# Patient Record
Sex: Female | Born: 1965 | Race: White | Hispanic: No | Marital: Married | Smoking: Current every day smoker
Health system: Southern US, Community
[De-identification: ages and names within clinical notes are randomized; demographics above are authoritative.]

## PROBLEM LIST (undated history)

## (undated) DIAGNOSIS — E079 Disorder of thyroid, unspecified: Secondary | ICD-10-CM

## (undated) DIAGNOSIS — I219 Acute myocardial infarction, unspecified: Secondary | ICD-10-CM

## (undated) HISTORY — PX: KNEE SURGERY: SHX244

---

## 1998-07-02 ENCOUNTER — Emergency Department (HOSPITAL_COMMUNITY): Admission: EM | Admit: 1998-07-02 | Discharge: 1998-07-02 | Payer: Self-pay | Admitting: Emergency Medicine

## 1999-08-20 ENCOUNTER — Emergency Department (HOSPITAL_COMMUNITY): Admission: EM | Admit: 1999-08-20 | Discharge: 1999-08-20 | Payer: Self-pay

## 1999-11-09 ENCOUNTER — Emergency Department (HOSPITAL_COMMUNITY): Admission: EM | Admit: 1999-11-09 | Discharge: 1999-11-09 | Payer: Self-pay | Admitting: Emergency Medicine

## 2000-04-12 ENCOUNTER — Emergency Department (HOSPITAL_COMMUNITY): Admission: EM | Admit: 2000-04-12 | Discharge: 2000-04-12 | Payer: Self-pay | Admitting: *Deleted

## 2000-04-20 ENCOUNTER — Emergency Department (HOSPITAL_COMMUNITY): Admission: EM | Admit: 2000-04-20 | Discharge: 2000-04-20 | Payer: Self-pay

## 2000-05-06 ENCOUNTER — Emergency Department (HOSPITAL_COMMUNITY): Admission: EM | Admit: 2000-05-06 | Discharge: 2000-05-06 | Payer: Self-pay | Admitting: *Deleted

## 2000-05-10 ENCOUNTER — Inpatient Hospital Stay (HOSPITAL_COMMUNITY): Admission: AD | Admit: 2000-05-10 | Discharge: 2000-05-10 | Payer: Self-pay | Admitting: *Deleted

## 2000-05-10 ENCOUNTER — Encounter: Payer: Self-pay | Admitting: *Deleted

## 2000-09-14 ENCOUNTER — Ambulatory Visit (HOSPITAL_COMMUNITY): Admission: RE | Admit: 2000-09-14 | Discharge: 2000-09-14 | Payer: Self-pay | Admitting: Obstetrics

## 2000-09-14 ENCOUNTER — Encounter: Payer: Self-pay | Admitting: Obstetrics

## 2000-10-31 ENCOUNTER — Inpatient Hospital Stay (HOSPITAL_COMMUNITY): Admission: AD | Admit: 2000-10-31 | Discharge: 2000-10-31 | Payer: Self-pay | Admitting: *Deleted

## 2000-12-11 ENCOUNTER — Encounter (INDEPENDENT_AMBULATORY_CARE_PROVIDER_SITE_OTHER): Payer: Self-pay | Admitting: Specialist

## 2000-12-11 ENCOUNTER — Inpatient Hospital Stay (HOSPITAL_COMMUNITY): Admission: AD | Admit: 2000-12-11 | Discharge: 2000-12-14 | Payer: Self-pay | Admitting: Registered Nurse

## 2002-08-29 ENCOUNTER — Encounter (INDEPENDENT_AMBULATORY_CARE_PROVIDER_SITE_OTHER): Payer: Self-pay | Admitting: Specialist

## 2002-08-30 ENCOUNTER — Ambulatory Visit (HOSPITAL_COMMUNITY): Admission: RE | Admit: 2002-08-30 | Discharge: 2002-08-30 | Payer: Self-pay | Admitting: Obstetrics

## 2006-08-02 ENCOUNTER — Ambulatory Visit (HOSPITAL_COMMUNITY): Admission: RE | Admit: 2006-08-02 | Discharge: 2006-08-02 | Payer: Self-pay | Admitting: Obstetrics

## 2006-08-02 ENCOUNTER — Encounter (INDEPENDENT_AMBULATORY_CARE_PROVIDER_SITE_OTHER): Payer: Self-pay | Admitting: *Deleted

## 2009-01-23 ENCOUNTER — Ambulatory Visit: Payer: Self-pay | Admitting: Diagnostic Radiology

## 2009-01-23 ENCOUNTER — Emergency Department (HOSPITAL_BASED_OUTPATIENT_CLINIC_OR_DEPARTMENT_OTHER): Admission: EM | Admit: 2009-01-23 | Discharge: 2009-01-23 | Payer: Self-pay | Admitting: Emergency Medicine

## 2009-02-26 ENCOUNTER — Emergency Department (HOSPITAL_BASED_OUTPATIENT_CLINIC_OR_DEPARTMENT_OTHER): Admission: EM | Admit: 2009-02-26 | Discharge: 2009-02-26 | Payer: Self-pay | Admitting: Emergency Medicine

## 2009-02-26 ENCOUNTER — Ambulatory Visit: Payer: Self-pay | Admitting: Diagnostic Radiology

## 2011-01-01 NOTE — Op Note (Signed)
[facility]  Patient:    Rachel Maldonado, Rachel Maldonado                       MRN: [id number] Proc. Date: 12/11/00 Adm. Date:  [id number] Attending:  Venita Sheffield                           Operative Report  PREOPERATIVE DIAGNOSES:       1. Intrauterine pregnancy approximately                                  [redacted] weeks gestation with premature rupture                                  of membranes.                               2. Breech presentation.                               3. Active labor.  POSTOPERATIVE DIAGNOSES:      1. Intrauterine pregnancy approximately                                  [redacted] weeks gestation with premature rupture                                  of membranes.                               2. Breech presentation.                               3. Active labor.                               4. Viable female infant weighing 6 pounds                                  1 ounce with an Apgar of 7 and 9.  OPERATION:                    Primary low transverse cervical cesarean                               section.  SURGEON:                      Deniece Ree, M.D.  ANESTHESIA:                   Epidural, Dr. Vita Erm, Montez Hageman., M.D.  PEDIATRICS:                   Alver Sorrow. Mikle Bosworth, M.D.  ESTIMATED BLOOD LOSS:  350 cc.  COMPLICATIONS:                None.  DISPOSITION:                  The patient tolerated the procedure well and returned to the recovery room in satisfactory condition.  DESCRIPTION OF PROCEDURE:     The patient was taken to the operating room, prepped and draped in the usual fashion for a primary cesarean section. A low Pfannenstiel incision was made following the course of the previous incision. This was carried down to the fascia at which time the fascia was excised the extent of the incision. The midline was identified and rectus muscles separated. The abdominoperitoneum was then entered in a  vertical fashion using the Metzenbaum scissors. The visceral peritoneum was then excised bilaterally toward the round ligaments following which the lower uterine segment was scored, entered in the visceral peritoneum, and then excised bilaterally toward the round ligaments. The lower uterine segment was then scored, entered in the midline and then bluntly dissected open. With some difficulty, the right hand was introduced and with manipulation, the breech was delivered, the the infant was in a frank breech position. The nares and the pharynx was then sucked out with a suction bulb. The cord was clamped and the infant turned over to the pediatrician who was in attendance. Cord blood was then obtained following which the placenta, as well as all products of conception, were then manually removed from the uterine cavity. IV Pitocin as well as IV antibiotics were then begun. The myometrium was then closed using #1 chromic in a running locking stitch followed by an imbricated stitch, again using #1 chromic. Reperitonealization was carried out using 2-0 chromic in a running stitch. Hemostasis was present. Sponge and needle count was correct x 2. The abdominoperitoneum was then closed using 2-0 chromic in a running stitch followed by closure of the fascia using #1 Dexon in a running stitch. A subcuticular stitch using 4-0 Monocryl was used to close the skin. The procedure was then terminated. The patient tolerated the procedure well and returned to the recovery room in satisfactory condition. DD:  12/11/00 TD:  12/11/00 Job: [id number]

## 2011-01-01 NOTE — Discharge Summary (Signed)
[facility]  Patient:    Rachel Maldonado, Rachel Maldonado                       MRN: [id number] Adm. Date:  [id number] Disc. Date: 12/14/00 Attending:  Venita Sheffield                           Discharge Summary  HISTORY:                      The patient is a 45 year old primigravida who was admitted with ruptured membranes and labor with a breech presentation on December 11, 2000.  HOSPITAL COURSE:              She underwent a primary low transverse cesarean section, and had a female, Apgars 7 and 9, weighing 6 pounds 1 ounce. Postoperatively, her hemoglobin was 13. She did well. She was discharged home on the third postoperative day ambulatory and or a regular diet.  DISCHARGE MEDICATIONS:        Tylox one p.o. q.3-4h. p.r.n.  DISCHARGE FOLLOWUP:           The patient is to see me in six weeks.  DISCHARGE DIAGNOSIS:          Status post primary low transverse cesarean section at 36 weeks because of ruptured membranes, labor, and breech presentation. DD:  12/14/00 TD:  12/14/00 Job: [id number]

## 2011-01-01 NOTE — Op Note (Signed)
Rachel Maldonado, Rachel Maldonado              ACCOUNT NO.:  [id number]   MEDICAL RECORD NO.:  [id number]          PATIENT TYPE:  AMB   LOCATION:  SDC                           FACILITY:  [facility]   PHYSICIAN:  Kathreen Cosier, M.D.DATE OF BIRTH:  1965-10-19   DATE OF PROCEDURE:  08/02/2006  DATE OF DISCHARGE:                               OPERATIVE REPORT   PREOPERATIVE DIAGNOSIS:  Dysfunctional uterine bleeding.   PROCEDURE:  Hysteroscopy, dilatation and curettage, and NovaSure  ablation/   SURGEON:  Kathreen Cosier, M.D.   ANESTHESIA:  General.   PROCEDURE:  Patient placed on the operating table in lithotomy position  after general anesthesia administered.  Abdomen, perineum and vagina  prepped and draped, bladder emptied with a straight catheter.  Bimanual  exam revealed the uterus to be normal size, negative adnexa.  A speculum  placed in the vagina, cervix grasped with a single-tooth tenaculum.  Cervix injected with 19 mL of 1% Xylocaine, endocervix curetted, small  amount of tissue obtained and sent to pathology.  Endometrial cavity  sounded to 9 cm.  The cavity was posterior.  The cervix was measured at  5 cm, cavity length 4 cm.  Cervix dilated to a #27 Pratt, hysteroscope  inserted.  The cavity was noted to have a posterior polyp, otherwise  normal.  Sharp curettage performed, a large amount of tissue obtained.  NovaSure device was inserted.  The cavity width was 4 cm and the cavity  integrity test was passed and ablation occurred for 1 minute 29 seconds  at 88 watts.  Repeat hysteroscopy showed total ablation of the cavity.  Fluid deficit was 35 mL.  Endometrial tissue was sent to pathology.  The  patient tolerated the procedure well, taken to the recovery room in good  condition.           ______________________________  Kathreen Cosier, M.D.     BAM/MEDQ  D:  08/02/2006  T:  08/02/2006  Job:  [id number]

## 2011-01-01 NOTE — H&P (Signed)
[facility]  Patient:    Rachel Maldonado, Rachel Maldonado                       MRN: [id number] Adm. Date:  [id number] Attending:  Venita Sheffield                         History and Physical  HISTORY:                      The patient is a 45 year old primigravida, approximately 62 to [redacted] weeks gestation who is admitted because of premature ruptured membranes and a breech presentation and in active labor. The patient stated that her membranes ruptured with a large gush of fluid approximately two hours prior to her coming to triage. At the time of coming to triage, she was noted to have grossly ruptured membranes, a breech presentation, and active labor. She was scheduled for a cesarean section.  PAST MEDICAL HISTORY:         Significant in that patient had left ovary and tube removed in the past. She has also had a left breast lumpectomy. The patient has a history of pelvic inflammatory disease.  PHYSICAL EXAMINATION:  GENERAL:                      Well-developed, well-nourished female in labor-type distress.  HEENT:                        Within normal limits.  NECK:                         Supple.  BREASTS:                      Without masses, tenderness, or discharge.  LUNGS:                        Lungs clear to percussion and auscultation.  HEART:                        Normal sinus rhythm without murmur, rub, or gallop.  ABDOMEN:                      Approximately [redacted] weeks gestation size with good fetal heart tones.  EXTREMITIES:                  Within normal limits.  NEUROLOGIC:                   Within normal limits.  PELVIC:                       Cervix 2 cm dilated, breech presentation, and having good strong uterine contractions.  DIAGNOSIS:                    Intrauterine pregnancy with premature rupture of membranes, breech presentation.  PLAN:                         The plan is to proceed with a primary cesarean section. DD:   12/11/00 TD:  12/11/00 Job: [id number]

## 2011-01-01 NOTE — Op Note (Signed)
   NAMERYLLIE, NIELAND                        ACCOUNT NO.:  [id number]   MEDICAL RECORD NO.:  [id number]                   PATIENT TYPE:  AMB   LOCATION:  SDC                                  FACILITY:  [facility]   PHYSICIAN:  Kathreen Cosier, M.D.           DATE OF BIRTH:  12/02/1965   DATE OF PROCEDURE:  08/30/2002  DATE OF DISCHARGE:                                 OPERATIVE REPORT   PREOPERATIVE DIAGNOSIS:  Dysfunctional uterine bleeding.   POSTOPERATIVE DIAGNOSIS:  Dysfunctional uterine bleeding.   PROCEDURE:  Dilatation and curettage.   DESCRIPTION OF PROCEDURE:  Using MAC, patient in lithotomy position, the  perineum and vagina were prepped and draped, the bladder emptied with a  straight catheter.  Bimanual exam revealed the uterus to be enlarged with  myomas on the left, no prolapse.  A weighted speculum placed in the vagina.  The cervix was injected at 3, 9, and 12 o'clock with a total of 9 cubic  centimeters of 1% plain Xylocaine.  The anterior lip of the cervix was  grasped with a tenaculum and the endocervix curetted with a small amount of  tissue obtained.  The endometrial cavity was sounded to 9 cm.  The cervix  dilated to a #27 Shawnie Pons.  A thorough sharp curettage was done, obtaining a  large amount of tissue.  Polyp forceps were then inserted and no polyps  obtained.  The patient tolerated the procedure well, taken to the recovery  room in good condition.                                               Kathreen Cosier, M.D.    BAM/MEDQ  D:  08/30/2002  T:  08/30/2002  Job:  [id number]

## 2013-09-19 DIAGNOSIS — E039 Hypothyroidism, unspecified: Secondary | ICD-10-CM | POA: Diagnosis present

## 2013-11-29 DIAGNOSIS — I2510 Atherosclerotic heart disease of native coronary artery without angina pectoris: Secondary | ICD-10-CM

## 2014-10-04 DIAGNOSIS — I10 Essential (primary) hypertension: Secondary | ICD-10-CM | POA: Diagnosis present

## 2015-06-08 ENCOUNTER — Encounter (HOSPITAL_COMMUNITY): Payer: Self-pay | Admitting: Emergency Medicine

## 2015-06-08 ENCOUNTER — Other Ambulatory Visit: Payer: Self-pay

## 2015-06-08 ENCOUNTER — Emergency Department (HOSPITAL_COMMUNITY)
Admission: EM | Admit: 2015-06-08 | Discharge: 2015-06-09 | Disposition: A | Discharge status: Home / Self Care | Payer: Self-pay | Attending: Emergency Medicine | Admitting: Emergency Medicine

## 2015-06-08 ENCOUNTER — Emergency Department (HOSPITAL_COMMUNITY): Payer: Self-pay

## 2015-06-08 DIAGNOSIS — Z7982 Long term (current) use of aspirin: Secondary | ICD-10-CM | POA: Insufficient documentation

## 2015-06-08 DIAGNOSIS — I252 Old myocardial infarction: Secondary | ICD-10-CM | POA: Insufficient documentation

## 2015-06-08 DIAGNOSIS — Z720 Tobacco use: Secondary | ICD-10-CM | POA: Insufficient documentation

## 2015-06-08 DIAGNOSIS — R42 Dizziness and giddiness: Secondary | ICD-10-CM | POA: Insufficient documentation

## 2015-06-08 DIAGNOSIS — Z7902 Long term (current) use of antithrombotics/antiplatelets: Secondary | ICD-10-CM | POA: Insufficient documentation

## 2015-06-08 DIAGNOSIS — Z79899 Other long term (current) drug therapy: Secondary | ICD-10-CM | POA: Insufficient documentation

## 2015-06-08 HISTORY — DX: Acute myocardial infarction, unspecified: I21.9

## 2015-06-08 LAB — BASIC METABOLIC PANEL
Anion gap: 9 (ref 5–15)
BUN: 13 mg/dL (ref 6–20)
CALCIUM: 9.1 mg/dL (ref 8.9–10.3)
CO2: 24 mmol/L (ref 22–32)
CREATININE: 0.93 mg/dL (ref 0.44–1.00)
Chloride: 102 mmol/L (ref 101–111)
GFR calc Af Amer: >60 mL/min (ref >60)
Glucose, Bld: 149 mg/dL — ABNORMAL HIGH (ref 65–99)
Potassium: 2.8 mmol/L — ABNORMAL LOW (ref 3.5–5.1)
SODIUM: 135 mmol/L (ref 135–145)

## 2015-06-08 LAB — CBC WITH DIFFERENTIAL/PLATELET
Basophils Absolute: 0 10*3/uL (ref 0.0–0.1)
Basophils Relative: 0 %
EOS ABS: 0.3 10*3/uL (ref 0.0–0.7)
EOS PCT: 2 %
HCT: 46.2 % — ABNORMAL HIGH (ref 36.0–46.0)
Hemoglobin: 15.7 g/dL — ABNORMAL HIGH (ref 12.0–15.0)
LYMPHS ABS: 3.8 10*3/uL (ref 0.7–4.0)
Lymphocytes Relative: 30 %
MCH: 31.3 pg (ref 26.0–34.0)
MCHC: 34 g/dL (ref 30.0–36.0)
MCV: 92 fL (ref 78.0–100.0)
MONOS PCT: 6 %
Monocytes Absolute: 0.7 10*3/uL (ref 0.1–1.0)
Neutro Abs: 7.5 10*3/uL (ref 1.7–7.7)
Neutrophils Relative %: 62 %
PLATELETS: 407 10*3/uL — AB (ref 150–400)
RBC: 5.02 MIL/uL (ref 3.87–5.11)
RDW: 13.6 % (ref 11.5–15.5)
WBC: 12.3 10*3/uL — AB (ref 4.0–10.5)

## 2015-06-08 LAB — TROPONIN I

## 2015-06-08 MED ORDER — MECLIZINE HCL 25 MG PO TABS
25.0000 mg | ORAL_TABLET | Freq: Once | ORAL | Status: AC
  Administered 2015-06-08: 25 mg via ORAL
  Filled 2015-06-08: qty 1

## 2015-06-08 MED ORDER — HYDROMORPHONE HCL 1 MG/ML IJ SOLN: 1.0000 mg | Freq: Once | INTRAMUSCULAR | Status: DC

## 2015-06-08 MED ORDER — ONDANSETRON 4 MG PO TBDP
4.0000 mg | ORAL_TABLET | Freq: Once | ORAL | Status: AC
  Administered 2015-06-08: 4 mg via ORAL
  Filled 2015-06-08: qty 1

## 2015-06-08 MED ORDER — KETOROLAC TROMETHAMINE 30 MG/ML IJ SOLN: 30.0000 mg | Freq: Once | INTRAMUSCULAR | Status: DC

## 2015-06-08 NOTE — ED Notes (Signed)
Pt drank water with no issues  ?

## 2015-06-08 NOTE — ED Provider Notes (Signed)
CSN: [id number]     Arrival date & time 06/08/15  2154 History   First MD Initiated Contact with Patient 06/08/15 2211     Chief Complaint  Patient presents with  . Dizziness     (Consider location/radiation/quality/duration/timing/severity/associated sxs/prior Treatment) HPI Comments: Patient presents with a 2 day history of room spinning dizziness that is worse with movement and better with rest. She reports a history of similar episodes attributed to inner ear problems in the past. Denies any recent URI symptoms or fever or sinus issues. Has had nausea but no vomiting. No focal weakness, numbness or tingling. No vision change. Has had falls but did not hit her head. No loss of consciousness. No chest pain or shortness of breath. History of NSTEMI last year that was medically managed at [facility].  The history is provided by the patient.    Past Medical History  Diagnosis Date  . Heart attack [employer](HCC)    Past Surgical History  Procedure Laterality Date  . Knee surgery     No family history on file. Social History  Substance Use Topics  . Smoking status: Current Every Day Smoker -- 0.75 packs/day    Types: Cigarettes  . Smokeless tobacco: None  . Alcohol Use: No   OB History    No data available     Review of Systems  Constitutional: Negative for fever, activity change and appetite change.  HENT: Negative for congestion.   Eyes: Negative for photophobia and visual disturbance.  Respiratory: Negative for cough, chest tightness and shortness of breath.   Cardiovascular: Negative for chest pain.  Gastrointestinal: Negative for nausea, vomiting and abdominal pain.  Genitourinary: Negative for dysuria, vaginal bleeding and vaginal discharge.  Musculoskeletal: Negative for myalgias, back pain and arthralgias.  Neurological: Positive for dizziness. Negative for syncope, weakness, light-headedness, numbness and headaches.  A complete 10 system review of systems was obtained and  all systems are negative except as noted in the HPI and PMH.      Allergies  Review of patient's allergies indicates no known allergies.  Home Medications   Prior to Admission medications   Medication Sig Start Date End Date Taking? Authorizing Provider  aspirin EC 325 MG tablet Take 325 mg by mouth daily.   Yes Historical Provider, MD  atorvastatin (LIPITOR) 40 MG tablet Take 20 mg by mouth every other day.   Yes Historical Provider, MD  carvedilol (COREG) 6.25 MG tablet Take 6.25 mg by mouth 2 (two) times daily. 04/19/14  Yes Historical Provider, MD  clopidogrel (PLAVIX) 75 MG tablet Take 75 mg by mouth daily. 06/05/15  Yes Historical Provider, MD  hydrochlorothiazide (HYDRODIURIL) 25 MG tablet Take 25 mg by mouth daily. 06/05/15  Yes Historical Provider, MD  levothyroxine (SYNTHROID, LEVOTHROID) 125 MCG tablet Take 125 mcg by mouth daily.   Yes Historical Provider, MD  lisinopril (PRINIVIL,ZESTRIL) 10 MG tablet Take 10 mg by mouth daily. 06/05/15  Yes Historical Provider, MD  Omega-3 1000 MG CAPS Take 1 g by mouth 2 (two) times daily.   Yes Historical Provider, MD  meclizine (ANTIVERT) 12.5 MG tablet Take 1 tablet (12.5 mg total) by mouth 3 (three) times daily as needed for dizziness. 06/09/15   Glynn OctaveStephen Donovon Micheletti, MD  ondansetron (ZOFRAN) 4 MG tablet Take 1 tablet (4 mg total) by mouth every 6 (six) hours. 06/09/15   Glynn OctaveStephen Jakaden Ouzts, MD   BP 132/78 mmHg  Pulse 87  Temp(Src) 97.6 F (36.4 C) (Oral)  Resp 16  Ht 5'  3" (1.6 m)  SpO2 95% Physical Exam  Constitutional: She is oriented to person, place, and time. She appears well-developed and well-nourished. No distress.  HENT:  Head: Normocephalic and atraumatic.  Mouth/Throat: Oropharynx is clear and moist. No oropharyngeal exudate.  Eyes: Conjunctivae and EOM are normal. Pupils are equal, round, and reactive to light.  Neck: Normal range of motion. Neck supple.  No meningismus.  Cardiovascular: Normal rate, regular rhythm, normal  heart sounds and intact distal pulses.   No murmur heard. Pulmonary/Chest: Effort normal and breath sounds normal. No respiratory distress.  Abdominal: Soft. There is no tenderness. There is no rebound and no guarding.  Musculoskeletal: Normal range of motion. She exhibits no edema or tenderness.  Neurological: She is alert and oriented to person, place, and time. No cranial nerve deficit. She exhibits normal muscle tone. Coordination normal.  No ataxia on finger to nose bilaterally. No pronator drift. 5/5 strength throughout. CN 2-12 intact. Negative Romberg. Equal grip strength. Sensation intact. Gait is normal.  No nystagmus. Patient does have catch up saccades when looking to the right. Test of skew is negative.  Skin: Skin is warm.  Psychiatric: She has a normal mood and affect. Her behavior is normal.  Nursing note and vitals reviewed.   ED Course  Procedures (including critical care time) Labs Review Labs Reviewed  CBC WITH DIFFERENTIAL/PLATELET - Abnormal; Notable for the following:    WBC 12.3 (*)    Hemoglobin 15.7 (*)    HCT 46.2 (*)    Platelets 407 (*)    All other components within normal limits  BASIC METABOLIC PANEL - Abnormal; Notable for the following:    Potassium 2.8 (*)    Glucose, Bld 149 (*)    All other components within normal limits  TROPONIN I    Imaging Review Ct Head Wo Contrast  06/08/2015  CLINICAL DATA:  Acute onset of dizziness.  Initial encounter. EXAM: CT HEAD WITHOUT CONTRAST TECHNIQUE: Contiguous axial images were obtained from the base of the skull through the vertex without intravenous contrast. COMPARISON:  None. FINDINGS: There is no evidence of acute infarction, mass lesion, or intra- or extra-axial hemorrhage on CT. The posterior fossa, including the cerebellum, brainstem and fourth ventricle, is within normal limits. The third and lateral ventricles, and basal ganglia are unremarkable in appearance. The cerebral hemispheres are symmetric in  appearance, with normal gray-white differentiation. No mass effect or midline shift is seen. There is no evidence of fracture; visualized osseous structures are unremarkable in appearance. The orbits are within normal limits. The paranasal sinuses and mastoid air cells are well-aerated. No significant soft tissue abnormalities are seen. IMPRESSION: Unremarkable noncontrast CT of the head. Electronically Signed   By: Roanna Raider M.D.   On: 06/08/2015 23:30   I have personally reviewed and evaluated these images and lab results as part of my medical decision-making.   EKG Interpretation None      MDM   Final diagnoses:  Vertigo  .  Vertigo that is positional associated with nausea.  Able to tolerate PO and ambulate. No headache, CP or SOB.  Exam with +head impulse testing suggesting peripheral etiology. Vertigo is positional and worse with movement.  Doubt CVA. EKG nsr. CT head negative. Labs with hypokalemia.  Improved with meclizine and zofran.  Tolerating PO and ambulatory. Followup with PCP. Return precautions discussed.    ED ECG REPORT   Date: 06/08/2015  Rate: 83  Rhythm: normal sinus rhythm  QRS Axis: normal  Intervals: normal  ST/T Wave abnormalities: nonspecific ST/T changes  Conduction Disutrbances:none  Narrative Interpretation:   Old EKG Reviewed: none available  I have personally reviewed the EKG tracing and agree with the computerized printout as noted.   Glynn Octave, MD 06/09/15 [phone]

## 2015-06-08 NOTE — ED Notes (Signed)
Pt ambulated in hall no assist. Patient was dizzy initially when standing but said she was okay after she stood for a bit

## 2015-06-08 NOTE — ED Notes (Signed)
Pt from home with history of previous inner ear infections. She presents tonight with dizziness. She states the room is spinning clockwise. Pt has not recently had a cold or sinus infection or pain, but she states the dizziness is similar.

## 2015-06-09 MED ORDER — MECLIZINE HCL 12.5 MG PO TABS: 12.5000 mg | ORAL_TABLET | Freq: Three times a day (TID) | ORAL | Status: DC | PRN

## 2015-06-09 MED ORDER — POTASSIUM CHLORIDE ER 20 MEQ PO TBCR: 20.0000 meq | EXTENDED_RELEASE_TABLET | Freq: Every day | ORAL | Status: DC

## 2015-06-09 MED ORDER — ONDANSETRON HCL 4 MG PO TABS: 4.0000 mg | ORAL_TABLET | Freq: Four times a day (QID) | ORAL | Status: DC

## 2015-06-09 NOTE — Discharge Instructions (Signed)

## 2015-06-09 NOTE — ED Notes (Signed)
Patient was alert, oriented and stable upon discharge. RN went over AVS and patient had no further questions.  

## 2016-09-16 ENCOUNTER — Emergency Department (HOSPITAL_BASED_OUTPATIENT_CLINIC_OR_DEPARTMENT_OTHER)
Admission: EM | Admit: 2016-09-16 | Discharge: 2016-09-16 | Disposition: A | Discharge status: Home / Self Care | Payer: Self-pay | Attending: Emergency Medicine | Admitting: Emergency Medicine

## 2016-09-16 ENCOUNTER — Encounter (HOSPITAL_BASED_OUTPATIENT_CLINIC_OR_DEPARTMENT_OTHER): Payer: Self-pay | Admitting: Emergency Medicine

## 2016-09-16 DIAGNOSIS — R05 Cough: Secondary | ICD-10-CM | POA: Insufficient documentation

## 2016-09-16 DIAGNOSIS — J111 Influenza due to unidentified influenza virus with other respiratory manifestations: Secondary | ICD-10-CM

## 2016-09-16 DIAGNOSIS — F17210 Nicotine dependence, cigarettes, uncomplicated: Secondary | ICD-10-CM | POA: Insufficient documentation

## 2016-09-16 DIAGNOSIS — R69 Illness, unspecified: Secondary | ICD-10-CM

## 2016-09-16 DIAGNOSIS — J3489 Other specified disorders of nose and nasal sinuses: Secondary | ICD-10-CM | POA: Insufficient documentation

## 2016-09-16 DIAGNOSIS — Z7982 Long term (current) use of aspirin: Secondary | ICD-10-CM | POA: Insufficient documentation

## 2016-09-16 NOTE — ED Triage Notes (Signed)
Pt c/o cough, HA, fever (not measured at home) since yesterday

## 2016-09-16 NOTE — Discharge Instructions (Signed)
Over-the-counter medications as needed for symptomatic relief.  Drink plenty of fluids and get plenty of rest.  Tylenol 1000 g every 6 hours as needed for fever.  Return to the emergency department if you develop chest pain, difficulty breathing, or other new and concerning symptoms.

## 2016-09-16 NOTE — ED Provider Notes (Signed)
[facility] Provider Note   CSN: [id number] Arrival date & time: 09/16/16  1333     History   Chief Complaint Chief Complaint  Patient presents with  . Cough    HPI Rachel Maldonado is a 51 y.o. female.  Patient is a 51 year old female with past medical history of coronary artery disease. She presents for evaluation of chest congestion, nasal congestion, cough, chills, that started yesterday. Her daughter was ill in a similar fashion last week. She denies any chest pain or difficulty breathing.   The history is provided by the patient.  Cough  This is a new problem. The current episode started yesterday. The problem occurs constantly. The problem has been gradually worsening. The cough is non-productive. The maximum temperature recorded prior to her arrival was 100 to 100.9 F. Associated symptoms include chills and rhinorrhea. Pertinent negatives include no chest pain and no shortness of breath. She is a smoker.    Past Medical History:  Diagnosis Date  . Heart attack     There are no active problems to display for this patient.   Past Surgical History:  Procedure Laterality Date  . CESAREAN SECTION    . KNEE SURGERY      OB History    No data available       Home Medications    Prior to Admission medications   Medication Sig Start Date End Date Taking? Authorizing Provider  aspirin EC 325 MG tablet Take 325 mg by mouth daily.    Historical Provider, MD  atorvastatin (LIPITOR) 40 MG tablet Take 20 mg by mouth every other day.    Historical Provider, MD  carvedilol (COREG) 6.25 MG tablet Take 6.25 mg by mouth 2 (two) times daily. 04/19/14   Historical Provider, MD  clopidogrel (PLAVIX) 75 MG tablet Take 75 mg by mouth daily. 06/05/15   Historical Provider, MD  hydrochlorothiazide (HYDRODIURIL) 25 MG tablet Take 25 mg by mouth daily. 06/05/15   Historical Provider, MD  levothyroxine (SYNTHROID, LEVOTHROID) 125 MCG tablet Take 125 mcg by mouth daily.     Historical Provider, MD  lisinopril (PRINIVIL,ZESTRIL) 10 MG tablet Take 10 mg by mouth daily. 06/05/15   Historical Provider, MD  meclizine (ANTIVERT) 12.5 MG tablet Take 1 tablet (12.5 mg total) by mouth 3 (three) times daily as needed for dizziness. 06/09/15   Glynn OctaveStephen Rancour, MD  Omega-3 1000 MG CAPS Take 1 g by mouth 2 (two) times daily.    Historical Provider, MD  ondansetron (ZOFRAN) 4 MG tablet Take 1 tablet (4 mg total) by mouth every 6 (six) hours. 06/09/15   Glynn OctaveStephen Rancour, MD  potassium chloride 20 MEQ TBCR Take 20 mEq by mouth daily. 06/09/15   Glynn OctaveStephen Rancour, MD    Family History History reviewed. No pertinent family history.  Social History Social History  Substance Use Topics  . Smoking status: Current Every Day Smoker    Packs/day: 0.75    Types: Cigarettes  . Smokeless tobacco: Never Used  . Alcohol use No     Allergies   Patient has no known allergies.   Review of Systems Review of Systems  Constitutional: Positive for chills.  HENT: Positive for rhinorrhea.   Respiratory: Positive for cough. Negative for shortness of breath.   Cardiovascular: Negative for chest pain.  All other systems reviewed and are negative.    Physical Exam Updated Vital Signs BP 146/92   Pulse 85   Temp 98.3 F (36.8 C) (Oral)   Resp  18   Ht 5\' 3"  (1.6 m)   Wt 230 lb (104.3 kg)   SpO2 99%   BMI 40.74 kg/m   Physical Exam  Constitutional: She is oriented to person, place, and time. She appears well-developed and well-nourished. No distress.  HENT:  Head: Normocephalic and atraumatic.  Mouth/Throat: Oropharynx is clear and moist.  Neck: Normal range of motion. Neck supple.  Cardiovascular: Normal rate and regular rhythm.  Exam reveals no gallop and no friction rub.   No murmur heard. Pulmonary/Chest: Effort normal and breath sounds normal. No respiratory distress. She has no wheezes.  Abdominal: Soft. Bowel sounds are normal. She exhibits no distension. There is no  tenderness.  Musculoskeletal: Normal range of motion.  Neurological: She is alert and oriented to person, place, and time.  Skin: Skin is warm and dry. She is not diaphoretic.  Nursing note and vitals reviewed.    ED Treatments / Results  Labs (all labs ordered are listed, but only abnormal results are displayed) Labs Reviewed - No data to display  EKG  EKG Interpretation None       Radiology No results found.  Procedures Procedures (including critical care time)  Medications Ordered in ED Medications - No data to display   Initial Impression / Assessment and Plan / ED Course  I have reviewed the triage vital signs and the nursing notes.  Pertinent labs & imaging results that were available during my care of the patient were reviewed by me and considered in my medical decision making (see chart for details).     Symptoms most likely viral in nature. Her lungs are clear and there is no hypoxia. She will be discharged with over-the-counter medications and return as needed if she worsens.  Final Clinical Impressions(s) / ED Diagnoses   Final diagnoses:  None    New Prescriptions New Prescriptions   No medications on file     Geoffery Lyons, MD 09/16/16 1402

## 2017-09-18 IMAGING — CT CT HEAD W/O CM
2 series · 15 of 30 positions shown, 19 images · non-contrast
Comparison: None.

CLINICAL DATA: Acute onset of dizziness.  Initial encounter.

EXAM:
CT HEAD WITHOUT CONTRAST
TECHNIQUE: Contiguous axial images were obtained from the base of the skull
through the vertex without intravenous contrast.

[Series 2: head w/o · axial · non-contrast · 0.48mm/px · z∈[+1319,+1444]mm · 13 of 31 slices shown, 17 images]
[im 3/31  brain]
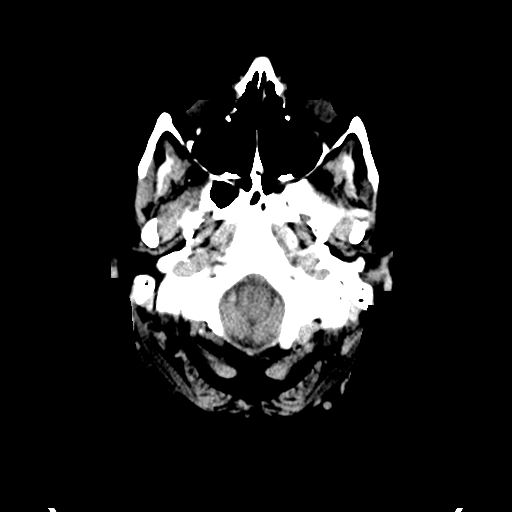
[im 3/31  bone]
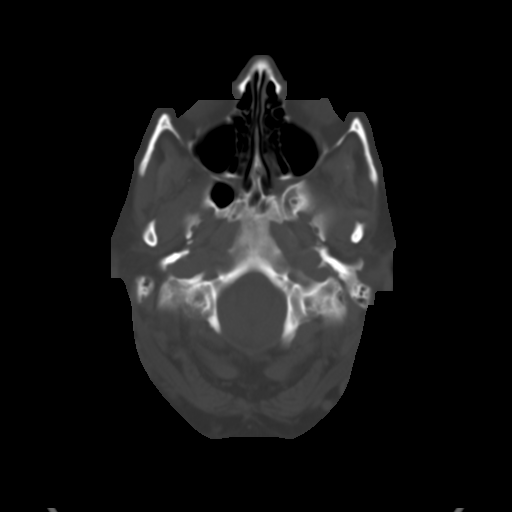
[im 5/31  brain]
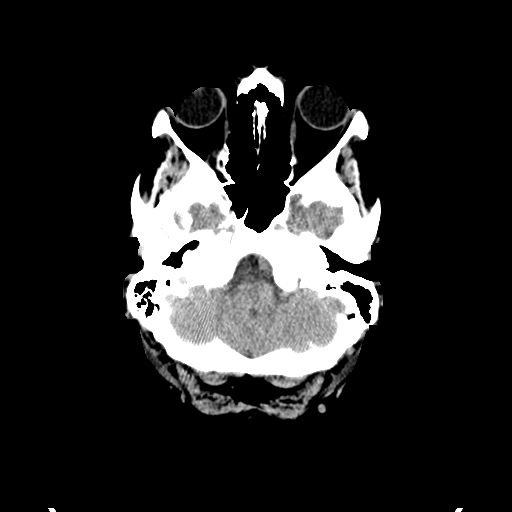
[im 7/31  brain]
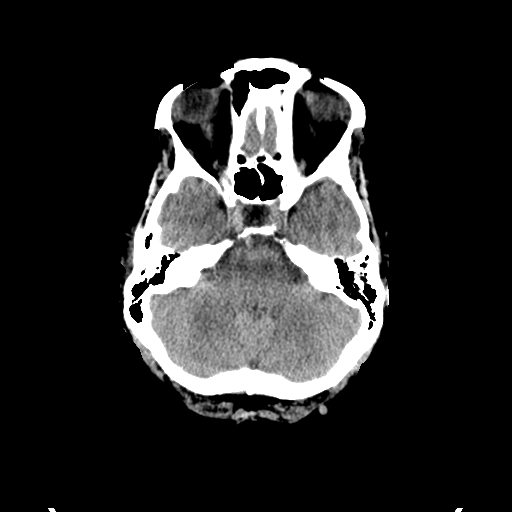
[im 9/31  brain]
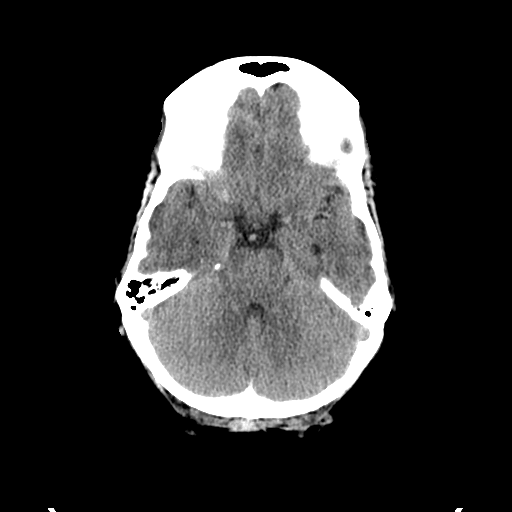
[im 11/31  brain]
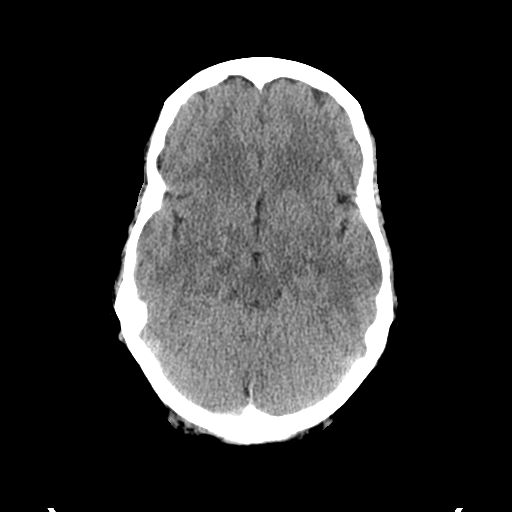
[im 11/31  bone]
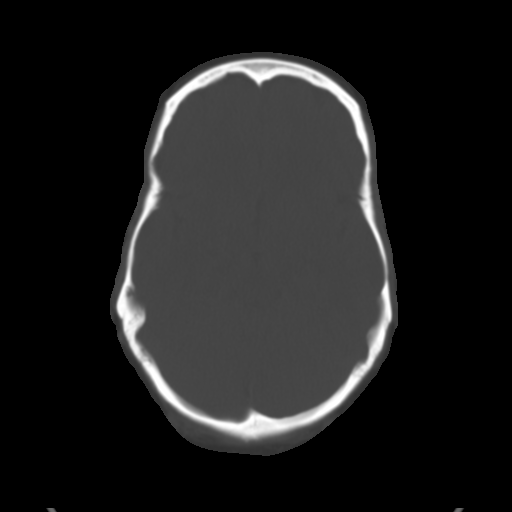
[im 13/31  brain]
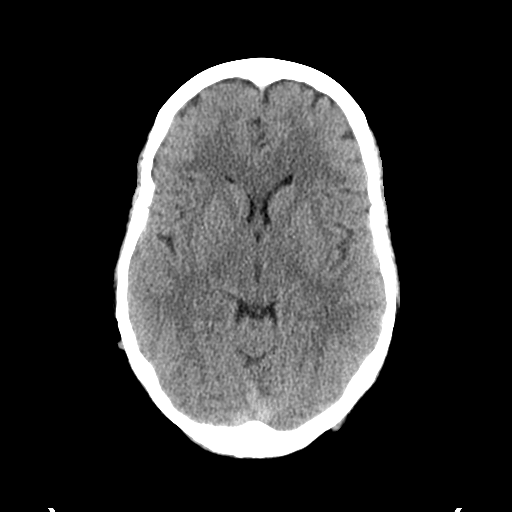
[im 16/31  brain]
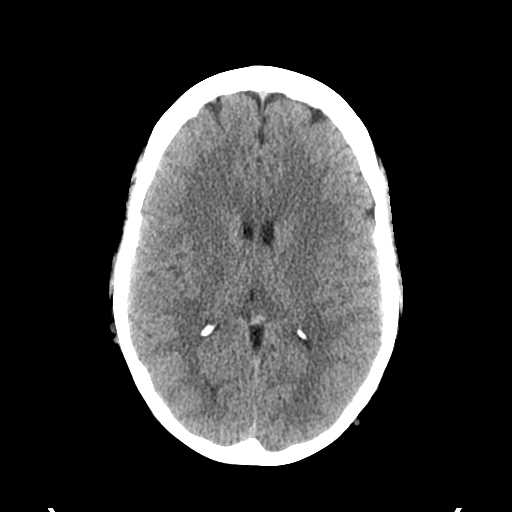
[im 18/31  brain]
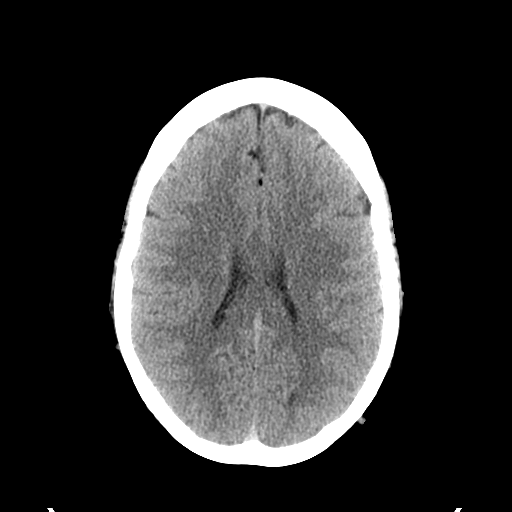
[im 20/31  brain]
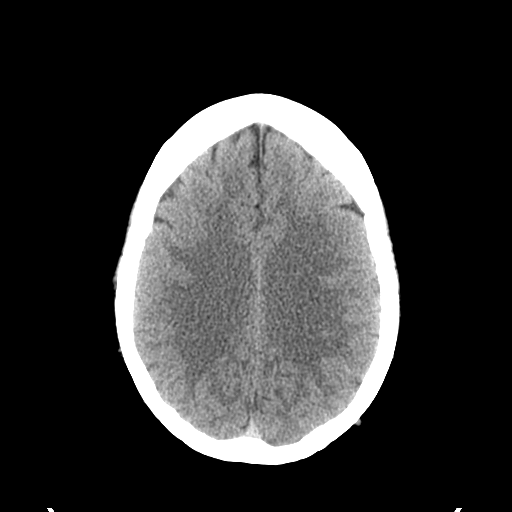
[im 20/31  bone]
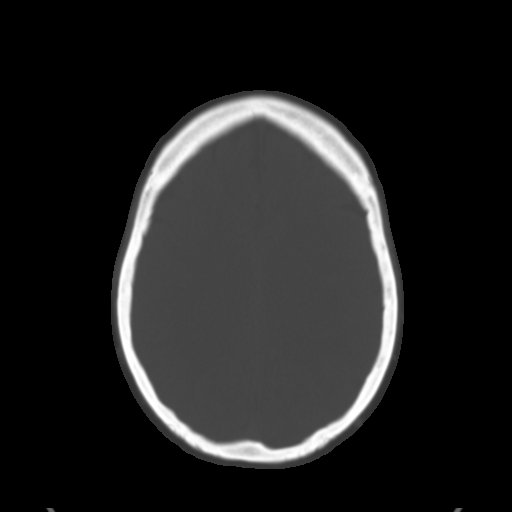
[im 22/31  brain]
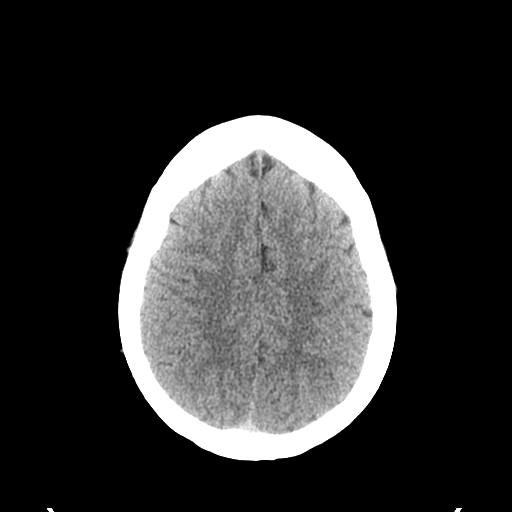
[im 24/31  brain]
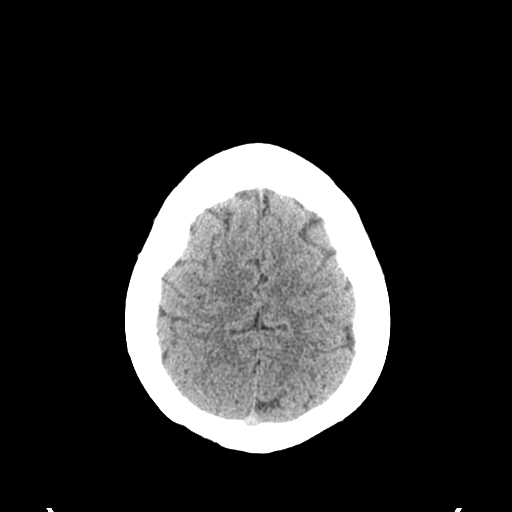
[im 26/31  brain]
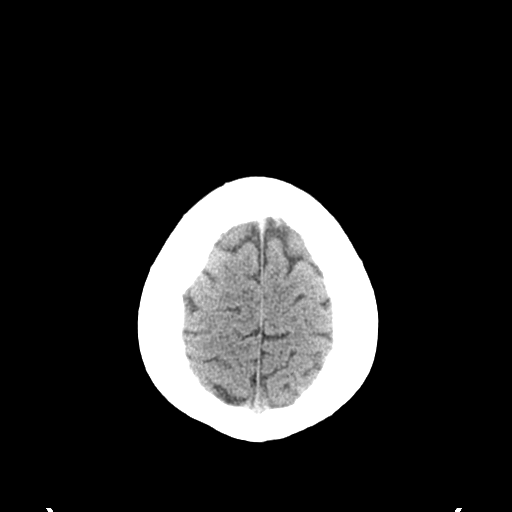
[im 28/31  brain]
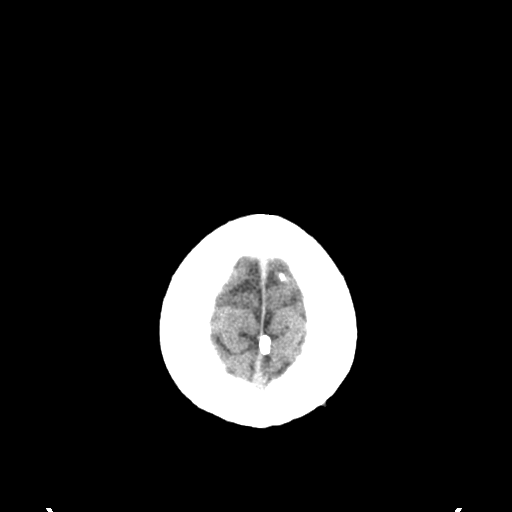
[im 28/31  bone]
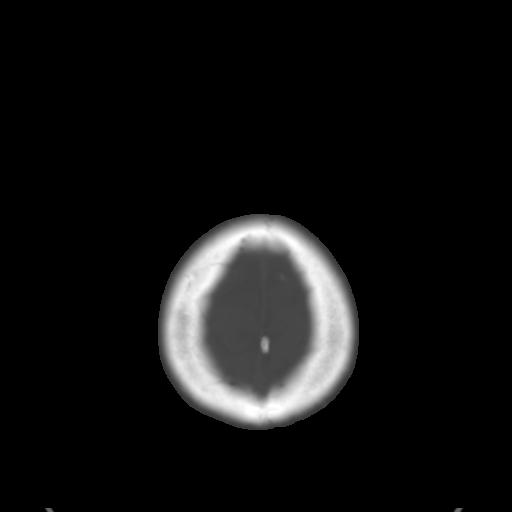

[Series 3: bone windows · axial · 0.48mm/px · z∈[+1319,+1339]mm · 2 of 31 slices shown]
[im 3/31  bone]
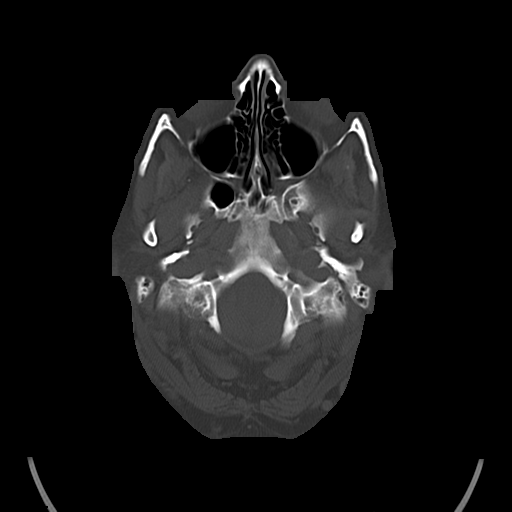
[im 7/31  bone]
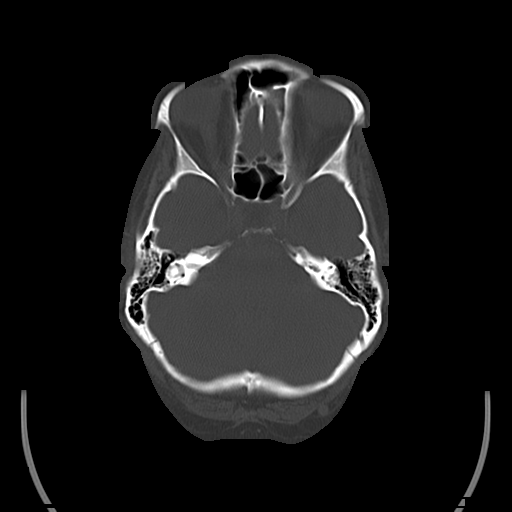

[15 of 30 positions shown; findings below may reference images not displayed]

FINDINGS: There is no evidence of acute infarction, mass lesion, or intra- or
extra-axial hemorrhage on CT.

The posterior fossa, including the cerebellum, brainstem and fourth
ventricle, is within normal limits. The third and lateral
ventricles, and basal ganglia are unremarkable in appearance. The
cerebral hemispheres are symmetric in appearance, with normal
gray-white differentiation. No mass effect or midline shift is seen.

There is no evidence of fracture; visualized osseous structures are
unremarkable in appearance. The orbits are within normal limits. The
paranasal sinuses and mastoid air cells are well-aerated. No
significant soft tissue abnormalities are seen.
IMPRESSION: Unremarkable noncontrast CT of the head.

## 2021-02-01 ENCOUNTER — Encounter (HOSPITAL_BASED_OUTPATIENT_CLINIC_OR_DEPARTMENT_OTHER): Payer: Self-pay | Admitting: Emergency Medicine

## 2021-02-01 ENCOUNTER — Inpatient Hospital Stay (HOSPITAL_BASED_OUTPATIENT_CLINIC_OR_DEPARTMENT_OTHER)
Admission: EM | Admit: 2021-02-01 | Discharge: 2021-02-03 | DRG: 392 | Disposition: A | Discharge status: Home / Self Care | Payer: Self-pay | Attending: Family Medicine | Admitting: Family Medicine

## 2021-02-01 ENCOUNTER — Other Ambulatory Visit: Payer: Self-pay

## 2021-02-01 DIAGNOSIS — E860 Dehydration: Secondary | ICD-10-CM | POA: Diagnosis present

## 2021-02-01 DIAGNOSIS — F17210 Nicotine dependence, cigarettes, uncomplicated: Secondary | ICD-10-CM | POA: Diagnosis present

## 2021-02-01 DIAGNOSIS — Z7982 Long term (current) use of aspirin: Secondary | ICD-10-CM

## 2021-02-01 DIAGNOSIS — A09 Infectious gastroenteritis and colitis, unspecified: Principal | ICD-10-CM | POA: Diagnosis present

## 2021-02-01 DIAGNOSIS — Z7902 Long term (current) use of antithrombotics/antiplatelets: Secondary | ICD-10-CM

## 2021-02-01 DIAGNOSIS — Z6841 Body Mass Index (BMI) 40.0 and over, adult: Secondary | ICD-10-CM

## 2021-02-01 DIAGNOSIS — R197 Diarrhea, unspecified: Secondary | ICD-10-CM

## 2021-02-01 DIAGNOSIS — E871 Hypo-osmolality and hyponatremia: Secondary | ICD-10-CM | POA: Diagnosis present

## 2021-02-01 DIAGNOSIS — I959 Hypotension, unspecified: Secondary | ICD-10-CM | POA: Diagnosis present

## 2021-02-01 DIAGNOSIS — E039 Hypothyroidism, unspecified: Secondary | ICD-10-CM | POA: Diagnosis present

## 2021-02-01 DIAGNOSIS — Z79890 Hormone replacement therapy (postmenopausal): Secondary | ICD-10-CM

## 2021-02-01 DIAGNOSIS — Z20822 Contact with and (suspected) exposure to covid-19: Secondary | ICD-10-CM | POA: Diagnosis present

## 2021-02-01 DIAGNOSIS — E876 Hypokalemia: Secondary | ICD-10-CM | POA: Diagnosis present

## 2021-02-01 DIAGNOSIS — I2510 Atherosclerotic heart disease of native coronary artery without angina pectoris: Secondary | ICD-10-CM

## 2021-02-01 DIAGNOSIS — I10 Essential (primary) hypertension: Secondary | ICD-10-CM | POA: Diagnosis present

## 2021-02-01 DIAGNOSIS — I252 Old myocardial infarction: Secondary | ICD-10-CM

## 2021-02-01 DIAGNOSIS — Z79899 Other long term (current) drug therapy: Secondary | ICD-10-CM

## 2021-02-01 DIAGNOSIS — N179 Acute kidney failure, unspecified: Secondary | ICD-10-CM | POA: Diagnosis present

## 2021-02-01 HISTORY — DX: Disorder of thyroid, unspecified: E07.9

## 2021-02-01 LAB — CBC WITH DIFFERENTIAL/PLATELET
Abs Immature Granulocytes: 0.09 10*3/uL — ABNORMAL HIGH (ref 0.00–0.07)
Basophils Absolute: 0.1 10*3/uL (ref 0.0–0.1)
Basophils Relative: 1 %
Eosinophils Absolute: 0.2 10*3/uL (ref 0.0–0.5)
Eosinophils Relative: 1 %
HCT: 48.5 % — ABNORMAL HIGH (ref 36.0–46.0)
Hemoglobin: 16.7 g/dL — ABNORMAL HIGH (ref 12.0–15.0)
Immature Granulocytes: 1 %
Lymphocytes Relative: 18 %
Lymphs Abs: 2.4 10*3/uL (ref 0.7–4.0)
MCH: 30.1 pg (ref 26.0–34.0)
MCHC: 34.4 g/dL (ref 30.0–36.0)
MCV: 87.5 fL (ref 80.0–100.0)
Monocytes Absolute: 1.2 10*3/uL — ABNORMAL HIGH (ref 0.1–1.0)
Monocytes Relative: 9 %
Neutro Abs: 9.4 10*3/uL — ABNORMAL HIGH (ref 1.7–7.7)
Neutrophils Relative %: 70 %
Platelets: 544 10*3/uL — ABNORMAL HIGH (ref 150–400)
RBC: 5.54 MIL/uL — ABNORMAL HIGH (ref 3.87–5.11)
RDW: 13.5 % (ref 11.5–15.5)
WBC: 13.2 10*3/uL — ABNORMAL HIGH (ref 4.0–10.5)
nRBC: 0 % (ref 0.0–0.2)

## 2021-02-01 LAB — MAGNESIUM: Magnesium: 2 mg/dL (ref 1.7–2.4)

## 2021-02-01 LAB — COMPREHENSIVE METABOLIC PANEL
ALT: 27 U/L (ref 0–44)
AST: 34 U/L (ref 15–41)
Albumin: 4.1 g/dL (ref 3.5–5.0)
Alkaline Phosphatase: 94 U/L (ref 38–126)
Anion gap: 14 (ref 5–15)
BUN: 27 mg/dL — ABNORMAL HIGH (ref 6–20)
CO2: 19 mmol/L — ABNORMAL LOW (ref 22–32)
Calcium: 9.6 mg/dL (ref 8.9–10.3)
Chloride: 97 mmol/L — ABNORMAL LOW (ref 98–111)
Creatinine, Ser: 3.28 mg/dL — ABNORMAL HIGH (ref 0.44–1.00)
GFR, Estimated: 16 mL/min — ABNORMAL LOW (ref >60)
Glucose, Bld: 128 mg/dL — ABNORMAL HIGH (ref 70–99)
Potassium: 3.4 mmol/L — ABNORMAL LOW (ref 3.5–5.1)
Sodium: 130 mmol/L — ABNORMAL LOW (ref 135–145)
Total Bilirubin: 0.4 mg/dL (ref 0.3–1.2)
Total Protein: 9 g/dL — ABNORMAL HIGH (ref 6.5–8.1)

## 2021-02-01 LAB — C DIFFICILE QUICK SCREEN W PCR REFLEX
C Diff antigen: NEGATIVE
C Diff interpretation: NOT DETECTED
C Diff toxin: NEGATIVE

## 2021-02-01 LAB — LIPASE, BLOOD: Lipase: 37 U/L (ref 11–51)

## 2021-02-01 LAB — RESP PANEL BY RT-PCR (FLU A&B, COVID) ARPGX2
Influenza A by PCR: NEGATIVE
Influenza B by PCR: NEGATIVE
SARS Coronavirus 2 by RT PCR: NEGATIVE

## 2021-02-01 LAB — HIV ANTIBODY (ROUTINE TESTING W REFLEX): HIV Screen 4th Generation wRfx: NONREACTIVE

## 2021-02-01 MED ORDER — ONDANSETRON HCL 4 MG/2ML IJ SOLN
4.0000 mg | Freq: Once | INTRAMUSCULAR | Status: AC
  Administered 2021-02-01: 4 mg via INTRAVENOUS
  Filled 2021-02-01: qty 2

## 2021-02-01 MED ORDER — ONDANSETRON HCL 4 MG PO TABS: 4.0000 mg | ORAL_TABLET | Freq: Four times a day (QID) | ORAL | Status: DC | PRN

## 2021-02-01 MED ORDER — LACTATED RINGERS IV BOLUS
2000.0000 mL | Freq: Once | INTRAVENOUS | Status: AC
  Administered 2021-02-01: 2000 mL via INTRAVENOUS

## 2021-02-01 MED ORDER — ACETAMINOPHEN 650 MG RE SUPP
650.0000 mg | Freq: Four times a day (QID) | RECTAL | Status: DC | PRN
Start: 2021-02-01 — End: 2021-02-03

## 2021-02-01 MED ORDER — ATORVASTATIN CALCIUM 10 MG PO TABS
20.0000 mg | ORAL_TABLET | ORAL | Status: DC
  Administered 2021-02-02: 20 mg via ORAL
  Filled 2021-02-01 (×2): qty 2

## 2021-02-01 MED ORDER — MECLIZINE HCL 25 MG PO TABS: 12.5000 mg | ORAL_TABLET | Freq: Three times a day (TID) | ORAL | Status: DC | PRN

## 2021-02-01 MED ORDER — CLOPIDOGREL BISULFATE 75 MG PO TABS
75.0000 mg | ORAL_TABLET | Freq: Every day | ORAL | Status: DC
  Administered 2021-02-02 – 2021-02-03 (×2): 75 mg via ORAL
  Filled 2021-02-01 (×2): qty 1

## 2021-02-01 MED ORDER — LACTATED RINGERS IV SOLN: INTRAVENOUS | Status: DC

## 2021-02-01 MED ORDER — POLYETHYLENE GLYCOL 3350 17 G PO PACK: 17.0000 g | PACK | Freq: Every day | ORAL | Status: DC | PRN

## 2021-02-01 MED ORDER — LOPERAMIDE HCL 2 MG PO CAPS
4.0000 mg | ORAL_CAPSULE | Freq: Once | ORAL | Status: AC
  Administered 2021-02-01: 4 mg via ORAL
  Filled 2021-02-01: qty 2

## 2021-02-01 MED ORDER — ONDANSETRON HCL 4 MG/2ML IJ SOLN: 4.0000 mg | Freq: Four times a day (QID) | INTRAMUSCULAR | Status: DC | PRN

## 2021-02-01 MED ORDER — LEVOTHYROXINE SODIUM 25 MCG PO TABS
125.0000 ug | ORAL_TABLET | Freq: Every day | ORAL | Status: DC
  Administered 2021-02-02 – 2021-02-03 (×2): 125 ug via ORAL
  Filled 2021-02-01 (×2): qty 1

## 2021-02-01 MED ORDER — ACETAMINOPHEN 325 MG PO TABS: 650.0000 mg | ORAL_TABLET | Freq: Four times a day (QID) | ORAL | Status: DC | PRN

## 2021-02-01 MED ORDER — POTASSIUM CHLORIDE CRYS ER 20 MEQ PO TBCR
40.0000 meq | EXTENDED_RELEASE_TABLET | Freq: Once | ORAL | Status: AC
  Administered 2021-02-01: 40 meq via ORAL
  Filled 2021-02-01: qty 2

## 2021-02-01 MED ORDER — SODIUM CHLORIDE 0.9% FLUSH
3.0000 mL | Freq: Two times a day (BID) | INTRAVENOUS | Status: DC
  Administered 2021-02-02 – 2021-02-03 (×3): 3 mL via INTRAVENOUS

## 2021-02-01 MED ORDER — ENOXAPARIN SODIUM 30 MG/0.3ML IJ SOSY
30.0000 mg | PREFILLED_SYRINGE | INTRAMUSCULAR | Status: DC
  Filled 2021-02-01: qty 0.3

## 2021-02-01 NOTE — H&P (Signed)
History and Physical   Rachel Maldonado DVV:[id number] DOB: [birth year] DOA: 02/01/2021  PCP: Renne Crigler, FNP   patient coming from: Home  Chief Complaint: Diarrhea  HPI: Rachel Maldonado is a 55 y.o. female with medical history significant of CAD, hypertension, hypothyroidism who presents with 5 days of diarrhea.  Patient reports that she has had 5 days of diarrhea.  She has had significant diarrhea for the past several days with up to 20 episodes a day.  She states she has had some nausea and dry heaving but no vomiting.  She states she has had significant decreased p.o. intake due to her symptoms.  She states she has continued to take her antihypertensives at home.  She denies any sick contacts.  She denies any recent antibiotic use.  She states that her bowel movements are very watery but are nonbloody.  She also denies any unusual food intake.  She denies chest pain, shortness of breath, fever.  ED Course: Vital signs in the ED initially significant for softer to low blood pressure in the 90s systolic however this improved with IV fluids to the 100s systolic.  Lab work-up showed sodium 130, chloride 97, potassium 3.4, bicarb 19, creatinine elevated 3.28 from baseline of 0.9, BUN 27, glucose 128, total protein 9.  We will CBC showed hemoglobin 16.7 mildly elevated from baseline 15.7 and platelets elevated to 544 from baseline 400.  Leukocytosis to 13.2.  Lipase normal, respiratory panel for flu and COVID-negative.  C. difficile and GI pathogen panel have been ordered and are pending.  Review of Systems: As per HPI otherwise all other systems reviewed and are negative.  Past Medical History:  Diagnosis Date   Heart attack [facility])    Thyroid disease     Past Surgical History:  Procedure Laterality Date   CESAREAN SECTION     KNEE SURGERY      Social History  reports that she has been smoking cigarettes. She has been smoking an average of 0.75 packs per day. She has never  used smokeless tobacco. She reports that she does not drink alcohol and does not use drugs.  No Known Allergies  Family History  Problem Relation Age of Onset   Stroke Father   Reviewed on admission  Prior to Admission medications   Medication Sig Start Date End Date Taking? Authorizing Provider  aspirin EC 325 MG tablet Take 325 mg by mouth daily.    [provider]  atorvastatin (LIPITOR) 40 MG tablet Take 20 mg by mouth every other day.    [provider]  carvedilol (COREG) 6.25 MG tablet Take 6.25 mg by mouth 2 (two) times daily. 04/19/14   [provider]  clopidogrel (PLAVIX) 75 MG tablet Take 75 mg by mouth daily. 06/05/15   [provider]  hydrochlorothiazide (HYDRODIURIL) 25 MG tablet Take 25 mg by mouth daily. 06/05/15   [provider]  levothyroxine (SYNTHROID, LEVOTHROID) 125 MCG tablet Take 125 mcg by mouth daily.    [provider]  lisinopril (PRINIVIL,ZESTRIL) 10 MG tablet Take 10 mg by mouth daily. 06/05/15   [provider]  meclizine (ANTIVERT) 12.5 MG tablet Take 1 tablet (12.5 mg total) by mouth 3 (three) times daily as needed for dizziness. 06/09/15   Rancour, Jeannett Senior, MD  Omega-3 1000 MG CAPS Take 1 g by mouth 2 (two) times daily.    [provider]  ondansetron (ZOFRAN) 4 MG tablet Take 1 tablet (4 mg total) by mouth every  6 (six) hours. 06/09/15   Rancour, Jeannett Senior, MD  potassium chloride 20 MEQ TBCR Take 20 mEq by mouth daily. 06/09/15   Glynn Octave, MD    Physical Exam: Vitals:   02/01/21 1300 02/01/21 1315 02/01/21 1624 02/01/21 1700  BP: 108/70 108/68 102/73 95/80  Pulse: 84 88 81 72  Resp: (!) 24 20 18 18   Temp:    97.7 F (36.5 C)  TempSrc:    Oral  SpO2: 99% 98% 98% 98%  Weight:      Height:       Physical Exam Constitutional:      General: She is not in acute distress.    Appearance: Normal appearance.  HENT:     Head: Normocephalic and atraumatic.     Mouth/Throat:      Mouth: Mucous membranes are moist.     Pharynx: Oropharynx is clear.  Eyes:     Extraocular Movements: Extraocular movements intact.     Pupils: Pupils are equal, round, and reactive to light.  Cardiovascular:     Rate and Rhythm: Normal rate and regular rhythm.     Pulses: Normal pulses.     Heart sounds: Normal heart sounds.  Pulmonary:     Effort: Pulmonary effort is normal. No respiratory distress.     Breath sounds: Normal breath sounds.  Abdominal:     General: There is no distension.     Palpations: Abdomen is soft.     Tenderness: There is no abdominal tenderness.     Comments: Increased bowel sounds  Musculoskeletal:        General: No swelling or deformity.  Skin:    General: Skin is warm and dry.  Neurological:     General: No focal deficit present.     Mental Status: Mental status is at baseline.    Labs on Admission: I have personally reviewed following labs and imaging studies  CBC: Recent Labs  Lab 02/01/21 0946  WBC 13.2*  NEUTROABS 9.4*  HGB 16.7*  HCT 48.5*  MCV 87.5  PLT 544*    Basic Metabolic Panel: Recent Labs  Lab 02/01/21 0946 02/01/21 1939  NA 130*  --   K 3.4*  --   CL 97*  --   CO2 19*  --   GLUCOSE 128*  --   BUN 27*  --   CREATININE 3.28*  --   CALCIUM 9.6  --   MG  --  2.0    GFR: Estimated Creatinine Clearance: 22.9 mL/min (A) (by C-G formula based on SCr of 3.28 mg/dL (H)).  Liver Function Tests: Recent Labs  Lab 02/01/21 0946  AST 34  ALT 27  ALKPHOS 94  BILITOT 0.4  PROT 9.0*  ALBUMIN 4.1    Urine analysis: No results found for: COLORURINE, APPEARANCEUR, LABSPEC, PHURINE, GLUCOSEU, HGBUR, BILIRUBINUR, KETONESUR, PROTEINUR, UROBILINOGEN, NITRITE, LEUKOCYTESUR  Radiological Exams on Admission: No results found.  EKG: Independently reviewed.  Sinus rhythm at 96 bpm.  Stable from previous.  Assessment/Plan Active Problems:   AKI (acute kidney injury) (HCC)  AKI Hypokalemia > Creatinine elevated to  3.28 from baseline of 0.9. > This is in the setting of 5 days of nausea vomiting and significant diarrhea with decreased p.o. intake. > Also has been continuing to take some of her home antihypertensives > Has received 2 L in the ED and started on infusion of LR > Also noted to have mild hyponatremia in the setting of being hypovolumemic at 130 and potassium of 3.4. -  Continue with IV fluids - Avoid nephrotoxic agents - Trend renal function and electrolytes - 40 mEq p.o. potassium and check magnesium  Diarrhea > Has had 5 days of nausea vomiting and up to 20 episodes of diarrhea per day.  Concern for infectious etiology. > Leukocytosis noted to be elevated to 13.2. - We will check GI pathogen panel and C. difficile  CAD > Status post NSTEMI in 2015 - On Plavix we will continue (states her aspirin was discontinued in favor this in the past) - Continue home atorvastatin - Holding home Coreg, lisinopril due to AKI and low blood pressures  Hypertension - Holding home antihypertensives in the setting of AKI and soft blood pressures  Hypothyroidism - Continue home Synthroid   DVT prophylaxis: Lovenox  Code Status:   Full  Family Communication:  None on admission Disposition Plan:   Patient is from:  Home  Anticipated DC to:  Home  Anticipated DC date:  1 to 3 days  Anticipated DC barriers: None  Consults called:  None  Admission status:  Inpatient, MedSurg   Severity of Illness: The appropriate patient status for this patient is INPATIENT. Inpatient status is judged to be reasonable and necessary in order to provide the required intensity of service to ensure the patient's safety. The patient's presenting symptoms, physical exam findings, and initial radiographic and laboratory data in the context of their chronic comorbidities is felt to place them at high risk for further clinical deterioration. Furthermore, it is not anticipated that the patient will be medically stable for  discharge from the hospital within 2 midnights of admission. The following factors support the patient status of inpatient.   " The patient's presenting symptoms include diarrhea, nausea. " The worrisome physical exam findings include increased bowel sounds. " The initial radiographic and laboratory data are worrisome because of potassium 3.4, sodium 130, bicarb 19, BUN 27, creatinine 3.28 from baseline 0.9, leukocytosis 13.2, platelets 544.. " The chronic co-morbidities include hypothyroidism, hypertension, CAD.   * I certify that at the point of admission it is my clinical judgment that the patient will require inpatient hospital care spanning beyond 2 midnights from the point of admission due to high intensity of service, high risk for further deterioration and high frequency of surveillance required.Synetta Fail MD [facility]  How to contact the [facility] Attending or Consulting provider 7A - 7P or covering provider during after hours 7P -7A, for this patient?   Check the care team in [facility] and look for a) attending/consulting [facility] provider listed and b) the [facility] team listed Log into [email or web address] and use [facility] universal password to access. If you do not have the password, please contact the hospital operator. Locate the [facility] provider you are looking for under [facility] and page to a number that you can be directly reached. If you still have difficulty reaching the provider, please page the [facility] (Director on Call) for the Hospitalists listed on amion for assistance.  02/01/2021, 10:02 PM

## 2021-02-01 NOTE — ED Triage Notes (Signed)
Pt arrives pov with c/o diarrhea x 4 days. Pt denies abdominal pain, reports decreased urine output, nausea, denies emesis. Pt denies abx use

## 2021-02-01 NOTE — Progress Notes (Signed)
Received a phone call from Facility: [facility]  Requesting MD: Anitra Lauth Patient with h/o CAD and hypothyroidism presenting with diarrhea x 5 days - up to 20+ episodes daily.  Minimal vomiting, + nausea.  Still taking PO meds - HCTZ, LIsinopril.  Creatinine 0.9 -> 3.5.  WBC 13.  Mildly hypotensive, getting 2L IVF.  No abx, no sick contacts.  No diarrhea since arrival in ER. Plan of care: Stool sample (salmonella?); IVF; hold BP meds; follow labs. The patient will be accepted for admission to med surg at [facility] when bed is available. Requested the transferring physician to send a stool sample for GI pathogen panel if she has another diarrheal episode prior to transfer.   Nursing staff, Please call the [facility] Admits & Consults System-Wide number at the top of Amion at the time of the patient's arrival so that the patient can be paged to the admitting physician.   Georgana Curio, M.D. [facility]

## 2021-02-01 NOTE — Plan of Care (Signed)

## 2021-02-01 NOTE — ED Provider Notes (Signed)
[facility] EMERGENCY DEPARTMENT Provider Note   CSN: [id number] Arrival date & time: 02/01/21  0844     History Chief Complaint  Patient presents with   Diarrhea    Rachel Maldonado is a 55 y.o. female.  The history is provided by the patient.  Diarrhea Quality:  Watery Severity:  Severe Onset quality:  Gradual Number of episodes:  >20 episodes a day Duration:  4 days Timing:  Constant Progression:  Unchanged Relieved by:  Nothing Worsened by:  Liquids Ineffective treatments:  Anti-motility medications Associated symptoms: vomiting   Associated symptoms: no abdominal pain, no chills and no fever   Associated symptoms comment:  Nausea Risk factors: no recent antibiotic use, no sick contacts, no suspicious food intake and no travel to endemic areas       Past Medical History:  Diagnosis Date   Heart attack (HCC)    Thyroid disease     There are no problems to display for this patient.   Past Surgical History:  Procedure Laterality Date   CESAREAN SECTION     KNEE SURGERY       OB History   No obstetric history on file.     No family history on file.  Social History   Tobacco Use   Smoking status: Every Day    Packs/day: 0.75    Pack years: 0.00    Types: Cigarettes   Smokeless tobacco: Never  Substance Use Topics   Alcohol use: No   Drug use: No    Home Medications Prior to Admission medications   Medication Sig Start Date End Date Taking? Authorizing Provider  aspirin EC 325 MG tablet Take 325 mg by mouth daily.    [provider]  atorvastatin (LIPITOR) 40 MG tablet Take 20 mg by mouth every other day.    [provider]  carvedilol (COREG) 6.25 MG tablet Take 6.25 mg by mouth 2 (two) times daily. 04/19/14   [provider]  clopidogrel (PLAVIX) 75 MG tablet Take 75 mg by mouth daily. 06/05/15   [provider]  hydrochlorothiazide (HYDRODIURIL) 25 MG tablet Take 25 mg by mouth daily. 06/05/15    [provider]  levothyroxine (SYNTHROID, LEVOTHROID) 125 MCG tablet Take 125 mcg by mouth daily.    [provider]  lisinopril (PRINIVIL,ZESTRIL) 10 MG tablet Take 10 mg by mouth daily. 06/05/15   [provider]  meclizine (ANTIVERT) 12.5 MG tablet Take 1 tablet (12.5 mg total) by mouth 3 (three) times daily as needed for dizziness. 06/09/15   Rancour, Jeannett Senior, MD  Omega-3 1000 MG CAPS Take 1 g by mouth 2 (two) times daily.    [provider]  ondansetron (ZOFRAN) 4 MG tablet Take 1 tablet (4 mg total) by mouth every 6 (six) hours. 06/09/15   Rancour, Jeannett Senior, MD  potassium chloride 20 MEQ TBCR Take 20 mEq by mouth daily. 06/09/15   Glynn Octave, MD    Allergies    Patient has no known allergies.  Review of Systems   Review of Systems  Constitutional:  Negative for chills and fever.  Gastrointestinal:  Positive for diarrhea and vomiting. Negative for abdominal pain.  Genitourinary:        Decreased urine output  All other systems reviewed and are negative.  Physical Exam Updated Vital Signs BP 94/70   Pulse 82   Temp 97.7 F (36.5 C) (Oral)   Resp 19   Ht 5\' 3"  (1.6 m)   Wt 108.9  kg   SpO2 97%   BMI 42.51 kg/m   Physical Exam Vitals and nursing note reviewed.  Constitutional:      General: She is not in acute distress.    Appearance: Normal appearance. She is well-developed.  HENT:     Head: Normocephalic and atraumatic.     Mouth/Throat:     Mouth: Mucous membranes are dry.  Eyes:     Conjunctiva/sclera: Conjunctivae normal.     Pupils: Pupils are equal, round, and reactive to light.  Cardiovascular:     Rate and Rhythm: Normal rate and regular rhythm.     Heart sounds: No murmur heard. Pulmonary:     Effort: Pulmonary effort is normal. No respiratory distress.     Breath sounds: Normal breath sounds. No wheezing or rales.  Abdominal:     General: There is no distension.     Palpations: Abdomen is soft.     Tenderness:  There is no abdominal tenderness. There is no guarding or rebound.  Musculoskeletal:        General: No tenderness. Normal range of motion.     Cervical back: Normal range of motion and neck supple.     Right lower leg: No edema.     Left lower leg: No edema.  Skin:    General: Skin is warm and dry.     Findings: No erythema or rash.  Neurological:     Mental Status: She is alert and oriented to person, place, and time. Mental status is at baseline.  Psychiatric:        Mood and Affect: Mood normal.        Behavior: Behavior normal.    ED Results / Procedures / Treatments   Labs (all labs ordered are listed, but only abnormal results are displayed) Labs Reviewed  CBC WITH DIFFERENTIAL/PLATELET - Abnormal; Notable for the following components:      Result Value   WBC 13.2 (*)    RBC 5.54 (*)    Hemoglobin 16.7 (*)    HCT 48.5 (*)    Platelets 544 (*)    Neutro Abs 9.4 (*)    Monocytes Absolute 1.2 (*)    Abs Immature Granulocytes 0.09 (*)    All other components within normal limits  COMPREHENSIVE METABOLIC PANEL - Abnormal; Notable for the following components:   Sodium 130 (*)    Potassium 3.4 (*)    Chloride 97 (*)    CO2 19 (*)    Glucose, Bld 128 (*)    BUN 27 (*)    Creatinine, Ser 3.28 (*)    Total Protein 9.0 (*)    GFR, Estimated 16 (*)    All other components within normal limits  LIPASE, BLOOD    EKG EKG Interpretation  Date/Time:  Sunday February 01 2021 09:21:12 EDT Ventricular Rate:  96 PR Interval:  143 QRS Duration: 92 QT Interval:  355 QTC Calculation: 449 R Axis:   42 Text Interpretation: Sinus rhythm Probable left atrial enlargement No significant change since last tracing Confirmed by Gwyneth Sprout ([id number]) on 02/01/2021 9:26:29 AM  Radiology No results found.  Procedures Procedures   Medications Ordered in ED Medications  ondansetron (ZOFRAN) injection 4 mg (4 mg Intravenous Given 02/01/21 0949)  lactated ringers bolus 2,000 mL (2,000  mLs Intravenous New Bag/Given 02/01/21 0945)    ED Course  I have reviewed the triage vital signs and the nursing notes.  Pertinent labs & imaging results that were available during my  care of the patient were reviewed by me and considered in my medical decision making (see chart for details).    MDM Rules/Calculators/A&P                          Patient is a 55 year old female presenting today with 4 days of persistent diarrhea, poor oral intake and nausea with occasional vomiting.  Patient has no abdominal pain on exam.  She does note decreased urine output and has had very little to drink over the last 3 to 4 days.  She has continued to take her medications which do include lisinopril and hydrochlorothiazide.  The diarrhea has been nonbloody and is been very watery.  Nobody else in her home with similar symptoms.  She does not think she has had foodborne illness.  No prior issues with diarrhea or stomach problems.  On labs today patient does have a leukocytosis of 13,000 and has new acute kidney injury with a creatinine of over 3-1/2 from her baseline of 0.5.  Feel that this is a result of dehydration as well as probably the hydrochlorothiazide and lisinopril.  Potassium is only mildly low at 3.4.  Anion gap of 14.  Patient given 2 L of LR and then will continue a constant rate.  She was given Zofran with improvement of her symptoms and is now starting to sip on some apple juice.  Initially patient was mildly hypotensive in the 80s which improved to the low 100s after IV fluid.  No evidence of sepsis at this time as patient is afebrile and has no evidence of tachycardia.  Will admit for further care and ongoing hydration.  Ensuring renal function improves and she is able to tolerate p.o.'s.  MDM   Amount and/or Complexity of Data Reviewed Clinical lab tests: ordered and reviewed Tests in the medicine section of CPT: reviewed and ordered Decide to obtain previous medical records or to obtain  history from someone other than the patient: yes Obtain history from someone other than the patient: no Review and summarize past medical records: yes Discuss the patient with other providers: yes Independent visualization of images, tracings, or specimens: yes  Risk of Complications, Morbidity, and/or Mortality Presenting problems: moderate Diagnostic procedures: moderate Management options: moderate  Patient Progress Patient progress: improved   Final Clinical Impression(s) / ED Diagnoses Final diagnoses:  Diarrhea, unspecified type  AKI (acute kidney injury) [facility])    Rx / DC Orders ED Discharge Orders     None        Gwyneth Sprout, MD 02/01/21 1103

## 2021-02-02 LAB — COMPREHENSIVE METABOLIC PANEL
ALT: 26 U/L (ref 0–44)
AST: 27 U/L (ref 15–41)
Albumin: 2.7 g/dL — ABNORMAL LOW (ref 3.5–5.0)
Alkaline Phosphatase: 66 U/L (ref 38–126)
Anion gap: 6 (ref 5–15)
BUN: 19 mg/dL (ref 6–20)
CO2: 24 mmol/L (ref 22–32)
Calcium: 8.5 mg/dL — ABNORMAL LOW (ref 8.9–10.3)
Chloride: 105 mmol/L (ref 98–111)
Creatinine, Ser: 1.29 mg/dL — ABNORMAL HIGH (ref 0.44–1.00)
GFR, Estimated: 49 mL/min — ABNORMAL LOW (ref >60)
Glucose, Bld: 120 mg/dL — ABNORMAL HIGH (ref 70–99)
Potassium: 3.6 mmol/L (ref 3.5–5.1)
Sodium: 135 mmol/L (ref 135–145)
Total Bilirubin: 0.4 mg/dL (ref 0.3–1.2)
Total Protein: 6.1 g/dL — ABNORMAL LOW (ref 6.5–8.1)

## 2021-02-02 LAB — CBC
HCT: 37.8 % (ref 36.0–46.0)
Hemoglobin: 12.8 g/dL (ref 12.0–15.0)
MCH: 29.8 pg (ref 26.0–34.0)
MCHC: 33.9 g/dL (ref 30.0–36.0)
MCV: 88.1 fL (ref 80.0–100.0)
Platelets: 389 10*3/uL (ref 150–400)
RBC: 4.29 MIL/uL (ref 3.87–5.11)
RDW: 13.4 % (ref 11.5–15.5)
WBC: 11.8 10*3/uL — ABNORMAL HIGH (ref 4.0–10.5)
nRBC: 0 % (ref 0.0–0.2)

## 2021-02-02 MED ORDER — ENOXAPARIN SODIUM 60 MG/0.6ML IJ SOSY: 50.0000 mg | PREFILLED_SYRINGE | INTRAMUSCULAR | Status: DC

## 2021-02-02 NOTE — Progress Notes (Addendum)
PROGRESS NOTE  BAILEI BUIST KGU:[id number] DOB: [birth year] DOA: 02/01/2021 PCP: Renne Crigler, FNP  HPI/Recap of past 42 hours: 55 year old female with past medical history significant for coronary artery disease, hypertension, hypothyroidism, who presented to [facility] with 5-day history of diarrhea nausea poor p.o. intake.  The diarrhea was said to be very liquid up to 20 times a day.  Upon presentation to the ER she was slightly hypotensive and was given IV fluids 2 L denies any sick contacts no prolonged or recent antibiotic use.  Update: February 02, 2021: Patient seen and examined at bedside her husband is at bedside.  Patient stated she feels much better she has not had any more diarrhea stool since her last one around 5:00 this morning.  Denies any nausea or vomiting she actually ate solid food accidentally.  Assessment/Plan: Principal Problem:   AKI (acute kidney injury) (HCC) Active Problems:   Chronic coronary artery disease   HTN (hypertension)   Hypothyroidism   Diarrhea  1.  Infective diarrhea GI panel is positive for Campylobacter.  C. difficile is negative given the fact that it had been going on for 5 days and she does not have any more diarrhea she would not be started on antibiotics.  We will just continue IV hydration and enteric precaution  2.  Hypertension.  Patient presented with mild hypotension.  She has received IV fluid and it seems improved we will continue to monitor  3.  Hypothyroidism continue Synthroid  4.  AKI.  Due to the severe diarrhea and dehydration.  Her presenting creatinine was 3.28.  It is not improving to creatinine currently is 1.29. We will continue IV hydration with lactated Ringer solutions at the 150 mils an hour  5.  Electrolyte imbalance with mild hypokalemia and hyponatremia due to the diarrhea.   We will continue to rehydrate  and will replace potassium  6.  Mild leukocytosis due to the infectious diarrhea  improving. We will continue to monitor until improved antibiotics is not indicated at this time  7.  History of coronary artery disease . S/p NSTEMI  in 2015.stable .  continue Plavix - Continue home atorvastatin - continue to hold home Coreg, lisinopril due to AKI and low blood pressures  Code Status: Full  Severity of Illness: The appropriate patient status for this patient is INPATIENT. Inpatient status is judged to be reasonable and necessary in order to provide the required intensity of service to ensure the patient's safety. The patient's presenting symptoms, physical exam findings, and initial radiographic and laboratory data in the context of their chronic comorbidities is felt to place them at high risk for further clinical deterioration. Furthermore, it is not anticipated that the patient will be medically stable for discharge from the hospital within 2 midnights of admission. The following factors support the patient status of inpatient.   " The patient's presenting symptoms include severe diarrhea up to 20 times a day with dehydration and hypotension. "  * I certify that at the point of admission it is my clinical judgment that the patient will require inpatient hospital care spanning beyond 2 midnights from the point of admission due to high intensity of service, high risk for further deterioration and high frequency of surveillance required.*   Family Communication: Husband at bedside  Disposition Plan: Possible discharge in 1 to 2 days   Consultants: None  Procedures: None  Antimicrobials: None  DVT prophylaxis: Lovenox patient refused.  She wants to take  only her Plavix .   Objective: Vitals:   02/01/21 1700 02/01/21 2213 02/02/21 0612 02/02/21 1039  BP: 95/80 105/68 (!) 111/58 109/69  Pulse: 72 81 71 77  Resp: 18 18 16 16   Temp: 97.7 F (36.5 C) 97.8 F (36.6 C) 97.9 F (36.6 C) 98.1 F (36.7 C)  TempSrc: Oral Oral Oral Oral  SpO2: 98% 98% 97% 98%   Weight:    110.8 kg  Height:        Intake/Output Summary (Last 24 hours) at 02/02/2021 1813 Last data filed at 02/02/2021 1059 Gross per 24 hour  Intake 2436.61 ml  Output --  Net 2436.61 ml   Filed Weights   02/01/21 0909 02/02/21 1039  Weight: 108.9 kg 110.8 kg   Body mass index is 43.27 kg/m.  Exam:  General: 55 y.o. year-old female well developed well nourished in no acute distress.  Alert and oriented x3.  Overweight no distress very pleasant upbeat Cardiovascular: Regular rate and rhythm with no rubs or gallops.  No thyromegaly or JVD noted.   Respiratory: Clear to auscultation with no wheezes or rales. Good inspiratory effort. Abdomen: Soft nontender nondistended with normal bowel sounds x4 quadrants. Musculoskeletal: No lower extremity edema. 2/4 pulses in all 4 extremities. Skin: No ulcerative lesions noted or rashes, Psychiatry: Mood is appropriate for condition and setting mood and affect is upbeat    Data Reviewed: CBC: Recent Labs  Lab 02/01/21 0946 02/02/21 0817  WBC 13.2* 11.8*  NEUTROABS 9.4*  --   HGB 16.7* 12.8  HCT 48.5* 37.8  MCV 87.5 88.1  PLT 544* 389   Basic Metabolic Panel: Recent Labs  Lab 02/01/21 0946 02/01/21 1939 02/02/21 0817  NA 130*  --  135  K 3.4*  --  3.6  CL 97*  --  105  CO2 19*  --  24  GLUCOSE 128*  --  120*  BUN 27*  --  19  CREATININE 3.28*  --  1.29*  CALCIUM 9.6  --  8.5*  MG  --  2.0  --    GFR: Estimated Creatinine Clearance: 59 mL/min (A) (by C-G formula based on SCr of 1.29 mg/dL (H)). Liver Function Tests: Recent Labs  Lab 02/01/21 0946 02/02/21 0817  AST 34 27  ALT 27 26  ALKPHOS 94 66  BILITOT 0.4 0.4  PROT 9.0* 6.1*  ALBUMIN 4.1 2.7*   Recent Labs  Lab 02/01/21 0946  LIPASE 37   No results for input(s): AMMONIA in the last 168 hours. Coagulation Profile: No results for input(s): INR, PROTIME in the last 168 hours. Cardiac Enzymes: No results for input(s): CKTOTAL, CKMB, CKMBINDEX,  TROPONINI in the last 168 hours. BNP (last 3 results) No results for input(s): PROBNP in the last 8760 hours. HbA1C: No results for input(s): HGBA1C in the last 72 hours. CBG: No results for input(s): GLUCAP in the last 168 hours. Lipid Profile: No results for input(s): CHOL, HDL, LDLCALC, TRIG, CHOLHDL, LDLDIRECT in the last 72 hours. Thyroid Function Tests: No results for input(s): TSH, T4TOTAL, FREET4, T3FREE, THYROIDAB in the last 72 hours. Anemia Panel: No results for input(s): VITAMINB12, FOLATE, FERRITIN, TIBC, IRON, RETICCTPCT in the last 72 hours. Urine analysis: No results found for: COLORURINE, APPEARANCEUR, LABSPEC, PHURINE, GLUCOSEU, HGBUR, BILIRUBINUR, KETONESUR, PROTEINUR, UROBILINOGEN, NITRITE, LEUKOCYTESUR Sepsis Labs: @LABRCNTIP (procalcitonin:4,lacticidven:4)  ) Recent Results (from the past 240 hour(s))  Resp Panel by RT-PCR (Flu A&B, Covid) Nasopharyngeal Swab     Status: None   Collection Time: 02/01/21  12:16 PM   Specimen: Nasopharyngeal Swab; Nasopharyngeal(NP) swabs in vial transport medium  Result Value Ref Range Status   SARS Coronavirus 2 by RT PCR NEGATIVE NEGATIVE Final    Comment: (NOTE) SARS-CoV-2 target nucleic acids are NOT DETECTED.  The SARS-CoV-2 RNA is generally detectable in upper respiratory specimens during the acute phase of infection. The lowest concentration of SARS-CoV-2 viral copies this assay can detect is 138 copies/mL. A negative result does not preclude SARS-Cov-2 infection and should not be used as the sole basis for treatment or other patient management decisions. A negative result may occur with  improper specimen collection/handling, submission of specimen other than nasopharyngeal swab, presence of viral mutation(s) within the areas targeted by this assay, and inadequate number of viral copies(<138 copies/mL). A negative result must be combined with clinical observations, patient history, and epidemiological information. The  expected result is Negative.  Fact Sheet for Patients:  BloggerCourse.com  Fact Sheet for Healthcare Providers:  SeriousBroker.it  This test is no t yet approved or cleared by the [place] FDA and  has been authorized for detection and/or diagnosis of SARS-CoV-2 by FDA under an Emergency Use Authorization (EUA). This EUA will remain  in effect (meaning this test can be used) for the duration of the COVID-19 declaration under Section 564(b)(1) of the Act, 21 U.S.C.section 360bbb-3(b)(1), unless the authorization is terminated  or revoked sooner.       Influenza A by PCR NEGATIVE NEGATIVE Final   Influenza B by PCR NEGATIVE NEGATIVE Final    Comment: (NOTE) The Xpert Xpress SARS-CoV-2/FLU/RSV plus assay is intended as an aid in the diagnosis of influenza from Nasopharyngeal swab specimens and should not be used as a sole basis for treatment. Nasal washings and aspirates are unacceptable for Xpert Xpress SARS-CoV-2/FLU/RSV testing.  Fact Sheet for Patients: BloggerCourse.com  Fact Sheet for Healthcare Providers: SeriousBroker.it  This test is not yet approved or cleared by the [place] FDA and has been authorized for detection and/or diagnosis of SARS-CoV-2 by FDA under an Emergency Use Authorization (EUA). This EUA will remain in effect (meaning this test can be used) for the duration of the COVID-19 declaration under Section 564(b)(1) of the Act, 21 U.S.C. section 360bbb-3(b)(1), unless the authorization is terminated or revoked.  Performed at [facility], [address] [place], [place] [address]   Gastrointestinal Panel by PCR , Stool     Status: Abnormal   Collection Time: 02/01/21 12:16 PM   Specimen: Nasopharyngeal Swab; Stool  Result Value Ref Range Status   Campylobacter species DETECTED (A) NOT DETECTED Final   Plesimonas shigelloides NOT  DETECTED NOT DETECTED Final   Salmonella species NOT DETECTED NOT DETECTED Final   Yersinia enterocolitica NOT DETECTED NOT DETECTED Final   Vibrio species NOT DETECTED NOT DETECTED Final   Vibrio cholerae NOT DETECTED NOT DETECTED Final   Enteroaggregative E coli (EAEC) NOT DETECTED NOT DETECTED Final   Enteropathogenic E coli (EPEC) NOT DETECTED NOT DETECTED Final   Enterotoxigenic E coli (ETEC) NOT DETECTED NOT DETECTED Final   Shiga like toxin producing E coli (STEC) NOT DETECTED NOT DETECTED Final   Shigella/Enteroinvasive E coli (EIEC) NOT DETECTED NOT DETECTED Final   Cryptosporidium NOT DETECTED NOT DETECTED Final   Cyclospora cayetanensis NOT DETECTED NOT DETECTED Final   Entamoeba histolytica NOT DETECTED NOT DETECTED Final   Giardia lamblia NOT DETECTED NOT DETECTED Final   Adenovirus F40/41 NOT DETECTED NOT DETECTED Final   Astrovirus NOT DETECTED NOT  DETECTED Final   Norovirus GI/GII NOT DETECTED NOT DETECTED Final   Rotavirus A NOT DETECTED NOT DETECTED Final   Sapovirus (I, II, IV, and V) NOT DETECTED NOT DETECTED Final    Comment: Performed at [facility], [address], [place], [place] [address]  C Difficile Quick Screen w PCR reflex     Status: None   Collection Time: 02/01/21 12:16 PM   Specimen: Nasopharyngeal Swab; Stool  Result Value Ref Range Status   C Diff antigen NEGATIVE NEGATIVE Final   C Diff toxin NEGATIVE NEGATIVE Final   C Diff interpretation No C. difficile detected.  Final    Comment: Performed at [facility], [address], [place], [place] [address]      Studies: No results found.  Scheduled Meds:  atorvastatin  20 mg Oral QODAY   clopidogrel  75 mg Oral Daily   enoxaparin (LOVENOX) injection  30 mg Subcutaneous Q24H   levothyroxine  125 mcg Oral Daily   sodium chloride flush  3 mL Intravenous Q12H    Continuous Infusions:  lactated ringers 150 mL/hr at 02/02/21 1143     LOS: 1 day     Myrtie Neither,  MD [facility]  To reach me or the doctor on call, go to: [email or web address] Password [facility]  02/02/2021, 6:13 PM

## 2021-02-02 NOTE — Plan of Care (Signed)
  Problem: Clinical Measurements: Goal: Diagnostic test results will improve Outcome: Progressing   Problem: Elimination: Goal: Will not experience complications related to bowel motility Outcome: Progressing   

## 2021-02-02 NOTE — Progress Notes (Signed)
Initial Nutrition Assessment  DOCUMENTATION CODES:   Morbid obesity  INTERVENTION:   - MVI with minerals daily  - Encourage PO intake  NUTRITION DIAGNOSIS:   Inadequate oral intake related to nausea, vomiting, diarrhea as evidenced by per patient/family report.  GOAL:   Patient will meet greater than or equal to 90% of their needs  MONITOR:   PO intake, Diet advancement, Labs, Weight trends, I & O's  REASON FOR ASSESSMENT:   Malnutrition Screening Tool    ASSESSMENT:   55 year old female who presented to the ED on 6/19 with complaints of diarrhea x 5 days, nausea, poor PO intake. PMH of CAD, hypothyroidism, HTN. Pt admitted with AKI and concern for infectious etiology of diarrhea.  Pt currently on a full liquid diet with no POs documented in chart. Spoke with pt at bedside who reports receiving a breakfast meal tray that had solid food on it (bacon, [place] toast). Pt ate some of the solid food and was able to keep it down. Pt consumed some of her full liquid lunch tray (cream of chicken soup) along with a smoothie from [employer]. Pt has not had any N/V or diarrhea so far today. Pt reports that she does feel very full so is not going to consume any more food/drinks at this time.  Pt reports that she began having diarrhea on Tuesday night of last week and it worsened on Wednesday evening. Since Wednesday evening, pt has not had any solid food. She has only consumed liquids. She reports dry heaving with water but able to keep Sprite down.  Pt denies any recent changes in her weight. Reviewed weight history in chart. Current weight is up compared to weight from February 2018 encounter.  Pt declined any oral nutrition supplements at this time. RD will order daily MVI with minerals  Medications reviewed. IVF: LR @ 150 ml/hr  Labs reviewed: creatinine 1.29  I/O's: +3.9 L since admit  NUTRITION - FOCUSED PHYSICAL EXAM:  Flowsheet Row Most Recent Value  Orbital Region No  depletion  Upper Arm Region No depletion  Thoracic and Lumbar Region No depletion  Buccal Region No depletion  Temple Region No depletion  Clavicle Bone Region No depletion  Clavicle and Acromion Bone Region No depletion  Scapular Bone Region No depletion  Dorsal Hand No depletion  Patellar Region No depletion  Anterior Thigh Region No depletion  Posterior Calf Region No depletion  Edema (RD Assessment) None  Hair Reviewed  Eyes Reviewed  Mouth Reviewed  Skin Reviewed  Nails Reviewed       Diet Order:   Diet Order             Diet full liquid Room service appropriate? Yes; Fluid consistency: Thin  Diet effective now                   EDUCATION NEEDS:   Education needs have been addressed  Skin:  Skin Assessment: Reviewed RN Assessment  Last BM:  02/01/21  Height:   Ht Readings from Last 1 Encounters:  02/01/21 5\' 3"  (1.6 m)    Weight:   Wt Readings from Last 1 Encounters:  02/02/21 110.8 kg    BMI:  Body mass index is 43.27 kg/m.  Estimated Nutritional Needs:   Kcal:  2000-2200  Protein:  90-110 grams  Fluid:  >/= 2.0 L    02/04/21, MS, RD, LDN Inpatient Clinical Dietitian Please see AMiON for contact information.

## 2021-02-02 NOTE — TOC Initial Note (Signed)
Transition of Care [facility]) - Initial/Assessment Note    Patient Details  Name: Rachel Maldonado MRN: [id number] Date of Birth: 09/24/1965  [facility] CM/SW Contact:    Bess Kinds, RN Phone Number: [phone] 02/02/2021, 10:58 AM  Clinical Narrative:                  Spoke with patient at the bedside. Home with husband. Verified active with PCP and will be able to follow up. Anticipates will be able to afford DC medications if any. Noted to be up ad lib in the room. No TOC needs identified.   Expected Discharge Plan: Home/Self Care Barriers to Discharge: Continued Medical Work up   Patient Goals and CMS Choice Patient states their goals for this hospitalization and ongoing recovery are:: return home CMS Medicare.gov Compare Post Acute Care list provided to:: Patient Choice offered to / list presented to : NA  Expected Discharge Plan and Services Expected Discharge Plan: Home/Self Care In-house Referral: NA Discharge Planning Services: CM Consult Post Acute Care Choice: NA Living arrangements for the past 2 months: Single Family Home                 DME Arranged: N/A DME Agency: NA       HH Arranged: NA HH Agency: NA        Prior Living Arrangements/Services Living arrangements for the past 2 months: Single Family Home Lives with:: Self, Spouse Patient language and need for interpreter reviewed:: Yes Do you feel safe going back to the place where you live?: Yes      Need for Family Participation in Patient Care: No (Comment)     Criminal Activity/Legal Involvement Pertinent to Current Situation/Hospitalization: No - Comment as needed  Activities of Daily Living Home Assistive Devices/Equipment: None ADL Screening (condition at time of admission) Patient's cognitive ability adequate to safely complete daily activities?: Yes Is the patient deaf or have difficulty hearing?: No Does the patient have difficulty seeing, even when wearing  glasses/contacts?: No Does the patient have difficulty concentrating, remembering, or making decisions?: No Patient able to express need for assistance with ADLs?: Yes Does the patient have difficulty dressing or bathing?: No Independently performs ADLs?: Yes (appropriate for developmental age) Does the patient have difficulty walking or climbing stairs?: No Weakness of Legs: None Weakness of Arms/Hands: None  Permission Sought/Granted                  Emotional Assessment Appearance:: Appears stated age Attitude/Demeanor/Rapport: Engaged Affect (typically observed): Accepting Orientation: : Oriented to Self, Oriented to  Time, Oriented to Place, Oriented to Situation Alcohol / Substance Use: Not Applicable Psych Involvement: No (comment)  Admission diagnosis:  AKI (acute kidney injury) (HCC) [N17.9] Diarrhea, unspecified type [R19.7] Patient Active Problem List   Diagnosis Date Noted   AKI (acute kidney injury) (HCC) 02/01/2021   Diarrhea 02/01/2021   HTN (hypertension) 10/04/2014   Chronic coronary artery disease 11/29/2013   Hypothyroidism 09/19/2013   PCP:  Renne Crigler, FNP Pharmacy:   [facility] [id number]), [place] - [address] [place] (SE) [place] [address] Phone: [phone] Fax: [phone]     Social Determinants of Health (SDOH) Interventions    Readmission Risk Interventions No flowsheet data found.

## 2021-02-03 LAB — GASTROINTESTINAL PANEL BY PCR, STOOL (REPLACES STOOL CULTURE)

## 2021-02-03 LAB — CBC
HCT: 36.8 % (ref 36.0–46.0)
Hemoglobin: 12.1 g/dL (ref 12.0–15.0)
MCH: 29.8 pg (ref 26.0–34.0)
MCHC: 32.9 g/dL (ref 30.0–36.0)
MCV: 90.6 fL (ref 80.0–100.0)
Platelets: 395 10*3/uL (ref 150–400)
RBC: 4.06 MIL/uL (ref 3.87–5.11)
RDW: 13.4 % (ref 11.5–15.5)
WBC: 11.7 10*3/uL — ABNORMAL HIGH (ref 4.0–10.5)
nRBC: 0 % (ref 0.0–0.2)

## 2021-02-03 LAB — BASIC METABOLIC PANEL
Anion gap: 6 (ref 5–15)
BUN: 11 mg/dL (ref 6–20)
CO2: 26 mmol/L (ref 22–32)
Calcium: 8.6 mg/dL — ABNORMAL LOW (ref 8.9–10.3)
Chloride: 106 mmol/L (ref 98–111)
Creatinine, Ser: 0.94 mg/dL (ref 0.44–1.00)
GFR, Estimated: >60 mL/min (ref >60)
Glucose, Bld: 104 mg/dL — ABNORMAL HIGH (ref 70–99)
Potassium: 3.4 mmol/L — ABNORMAL LOW (ref 3.5–5.1)
Sodium: 138 mmol/L (ref 135–145)

## 2021-02-03 NOTE — Plan of Care (Signed)
  Problem: Elimination: Goal: Will not experience complications related to bowel motility Outcome: Progressing   

## 2021-02-03 NOTE — Discharge Summary (Signed)
Discharge Summary  Rachel Maldonado GNF:[id number] DOB: 09/05/1965  PCP: Renne Crigler, FNP  Admit date: 02/01/2021 Discharge date: 02/03/2021  Time spent: 29.5 minutes  Recommendations for Outpatient Follow-up:  Primary care provider  Discharge Diagnoses:  Active Hospital Problems   Diagnosis Date Noted   AKI (acute kidney injury) (HCC) 02/01/2021   Diarrhea 02/01/2021   HTN (hypertension) 10/04/2014   CAD (coronary artery disease) 11/29/2013   Hypothyroidism 09/19/2013    Resolved Hospital Problems  No resolved problems to display.    Discharge Condition: Improved  Diet recommendation: Cardiac  Vitals:   02/02/21 2126 02/03/21 1126  BP: 118/80 124/78  Pulse: 83 86  Resp: 20 20  Temp: 98.1 F (36.7 C) 98.6 F (37 C)  SpO2: 96% 98%    History of present illness:  55 year old female with past medical history significant for coronary artery disease, hypertension, hypothyroidism, who presented to [facility] with 5-day history of diarrhea nausea poor p.o. intake.  The diarrhea was said to be very liquid up to 20 times a day.  Upon presentation to the ER she was slightly hypotensive and was given IV fluids 2 L denies any sick contacts no prolonged or recent antibiotic use.   Hospital Course:  Principal Problem:   AKI (acute kidney injury) (HCC) Active Problems:   CAD (coronary artery disease)   HTN (hypertension)   Hypothyroidism   Diarrhea  1.  Infective diarrhea GI panel is positive for Campylobacter.  C. difficile is negative given the fact that it had been going on for 5 days and she does not have any more diarrhea she would not be started on antibiotics.  We will just continue IV hydration and enteric precaution.  Patient has not had any diarrheal stools since admission.  She was advised on contact precaution.  Educational material was given  2.  Hypertension.  Patient presented with mild hypotension.  She has received IV fluid and it seems  improved we will continue to monitor  3.  Hypothyroidism continue Synthroid  4.  AKI.  Due to the severe diarrhea and dehydration.  Her presenting creatinine was 3.28.  It is not improving to creatinine currently is 1.29. We will continue IV hydration with lactated Ringer solutions at the 150 mils an hour Her AKI has resolved her current creatinine is 0.94  5.  Electrolyte imbalance with mild hypokalemia and hyponatremia due to the diarrhea.   We will continue to rehydrate and will replace potassium  6.  Mild leukocytosis due to the infectious diarrhea improving. We will continue to monitor until improved antibiotics is not indicated at this time  7.  History of coronary artery disease . S/p NSTEMI  in 2015.stable .  continue Plavix - Continue home atorvastatin - continue to hold home Coreg, lisinopril due to AKI and low blood pressures   Procedures: NONE   Consultations: NONE  Discharge Exam: BP 124/78 (BP Location: Right Arm)   Pulse 86   Temp 98.6 F (37 C) (Oral)   Resp 20   Ht 5\' 3"  (1.6 m)   Wt 111.9 kg   SpO2 98%   BMI 43.70 kg/m    General: Alert and oriented x3 she is in good spirit Cardiovascular: Regular rate and rhythm no murmur Respiratory: Clear to auscultation  Discharge Instructions You were cared for by a hospitalist during your hospital stay. If you have any questions about your discharge medications or the care you received while you were in the hospital  after you are discharged, you can call the unit and asked to speak with the hospitalist on call if the hospitalist that took care of you is not available. Once you are discharged, your primary care physician will handle any further medical issues. Please note that NO REFILLS for any discharge medications will be authorized once you are discharged, as it is imperative that you return to your primary care physician (or establish a relationship with a primary care physician if you do not have one) for your  aftercare needs so that they can reassess your need for medications and monitor your lab values.  Discharge Instructions     Call MD for:  persistant nausea and vomiting   Complete by: As directed    Call MD for:  temperature >100.4   Complete by: As directed    Diet - low sodium heart healthy   Complete by: As directed    Discharge instructions   Complete by: As directed    Continue to hydrate well he can incorporate some fruits to replenish your potassium may have been lost during her diarrhea. Please observe hand hygiene and pay particular attention to the education material that was given to you regarding the cause of your diarrhea which is a Campylobacter and how to prevent it and avoid spreading it      Allergies as of 02/03/2021       Reactions   Hydrocodone-acetaminophen Nausea And Vomiting   Says she can take "codeine"        Medication List     STOP taking these medications    meclizine 12.5 MG tablet Commonly known as: ANTIVERT   ondansetron 4 MG tablet Commonly known as: ZOFRAN   Potassium Chloride ER 20 MEQ Tbcr       TAKE these medications    ALLERGY PO Take 1 tablet by mouth daily.   atorvastatin 40 MG tablet Commonly known as: LIPITOR Take 20 mg by mouth See admin instructions. Take 20mg  by mouth every 3 days.   carvedilol 6.25 MG tablet Commonly known as: COREG Take 6.25 mg by mouth 2 (two) times daily.   clopidogrel 75 MG tablet Commonly known as: PLAVIX Take 75 mg by mouth daily.   levothyroxine 125 MCG tablet Commonly known as: SYNTHROID Take 125 mcg by mouth daily.   lisinopril 10 MG tablet Commonly known as: ZESTRIL Take 10 mg by mouth daily.   MULTIVITAMIN GUMMIES WOMENS PO Take 1 tablet by mouth daily.       Allergies  Allergen Reactions   Hydrocodone-Acetaminophen Nausea And Vomiting    Says she can take "codeine"       The results of significant diagnostics from this hospitalization (including imaging,  microbiology, ancillary and laboratory) are listed below for reference.    Significant Diagnostic Studies: No results found.  Microbiology: Recent Results (from the past 240 hour(s))  Resp Panel by RT-PCR (Flu A&B, Covid) Nasopharyngeal Swab     Status: None   Collection Time: 02/01/21 12:16 PM   Specimen: Nasopharyngeal Swab; Nasopharyngeal(NP) swabs in vial transport medium  Result Value Ref Range Status   SARS Coronavirus 2 by RT PCR NEGATIVE NEGATIVE Final    Comment: (NOTE) SARS-CoV-2 target nucleic acids are NOT DETECTED.  The SARS-CoV-2 RNA is generally detectable in upper respiratory specimens during the acute phase of infection. The lowest concentration of SARS-CoV-2 viral copies this assay can detect is 138 copies/mL. A negative result does not preclude SARS-Cov-2 infection and should not be used  as the sole basis for treatment or other patient management decisions. A negative result may occur with  improper specimen collection/handling, submission of specimen other than nasopharyngeal swab, presence of viral mutation(s) within the areas targeted by this assay, and inadequate number of viral copies(<138 copies/mL). A negative result must be combined with clinical observations, patient history, and epidemiological information. The expected result is Negative.  Fact Sheet for Patients:  BloggerCourse.comhttps://www.fda.gov/media/152166/download  Fact Sheet for Healthcare Providers:  SeriousBroker.ithttps://www.fda.gov/media/152162/download  This test is no t yet approved or cleared by the [place] FDA and  has been authorized for detection and/or diagnosis of SARS-CoV-2 by FDA under an Emergency Use Authorization (EUA). This EUA will remain  in effect (meaning this test can be used) for the duration of the COVID-19 declaration under Section 564(b)(1) of the Act, 21 U.S.C.section 360bbb-3(b)(1), unless the authorization is terminated  or revoked sooner.       Influenza A by PCR NEGATIVE  NEGATIVE Final   Influenza B by PCR NEGATIVE NEGATIVE Final    Comment: (NOTE) The Xpert Xpress SARS-CoV-2/FLU/RSV plus assay is intended as an aid in the diagnosis of influenza from Nasopharyngeal swab specimens and should not be used as a sole basis for treatment. Nasal washings and aspirates are unacceptable for Xpert Xpress SARS-CoV-2/FLU/RSV testing.  Fact Sheet for Patients: BloggerCourse.comhttps://www.fda.gov/media/152166/download  Fact Sheet for Healthcare Providers: SeriousBroker.ithttps://www.fda.gov/media/152162/download  This test is not yet approved or cleared by the [place] FDA and has been authorized for detection and/or diagnosis of SARS-CoV-2 by FDA under an Emergency Use Authorization (EUA). This EUA will remain in effect (meaning this test can be used) for the duration of the COVID-19 declaration under Section 564(b)(1) of the Act, 21 U.S.C. section 360bbb-3(b)(1), unless the authorization is terminated or revoked.  Performed at [facility] [place], [address] [place], [place] [id number]   Gastrointestinal Panel by PCR , Stool     Status: Abnormal   Collection Time: 02/01/21 12:16 PM   Specimen: Nasopharyngeal Swab; Stool  Result Value Ref Range Status   Campylobacter species DETECTED (A) NOT DETECTED Final    Comment: RESULT CALLED TO, READ BACK BY AND VERIFIED WITH: R.AUGUSTIN,RN 0736 02/03/21 GM    Plesimonas shigelloides NOT DETECTED NOT DETECTED Final   Salmonella species NOT DETECTED NOT DETECTED Final   Yersinia enterocolitica NOT DETECTED NOT DETECTED Final   Vibrio species NOT DETECTED NOT DETECTED Final   Vibrio cholerae NOT DETECTED NOT DETECTED Final   Enteroaggregative E coli (EAEC) NOT DETECTED NOT DETECTED Final   Enteropathogenic E coli (EPEC) NOT DETECTED NOT DETECTED Final   Enterotoxigenic E coli (ETEC) NOT DETECTED NOT DETECTED Final   Shiga like toxin producing E coli (STEC) NOT DETECTED NOT DETECTED Final   Shigella/Enteroinvasive E coli (EIEC) NOT  DETECTED NOT DETECTED Final   Cryptosporidium NOT DETECTED NOT DETECTED Final   Cyclospora cayetanensis NOT DETECTED NOT DETECTED Final   Entamoeba histolytica NOT DETECTED NOT DETECTED Final   Giardia lamblia NOT DETECTED NOT DETECTED Final   Adenovirus F40/41 NOT DETECTED NOT DETECTED Final   Astrovirus NOT DETECTED NOT DETECTED Final   Norovirus GI/GII NOT DETECTED NOT DETECTED Final   Rotavirus A NOT DETECTED NOT DETECTED Final   Sapovirus (I, II, IV, and V) NOT DETECTED NOT DETECTED Final    Comment: Performed at [facility], [address] [place], [place] [address]  C Difficile Quick Screen w PCR reflex     Status: None   Collection Time: 02/01/21  12:16 PM   Specimen: Nasopharyngeal Swab; Stool  Result Value Ref Range Status   C Diff antigen NEGATIVE NEGATIVE Final   C Diff toxin NEGATIVE NEGATIVE Final   C Diff interpretation No C. difficile detected.  Final    Comment: Performed at [facility], [address] [place], [place] [address]     Labs: Basic Metabolic Panel: Recent Labs  Lab 02/01/21 0946 02/01/21 1939 02/02/21 0817 02/03/21 0329  NA 130*  --  135 138  K 3.4*  --  3.6 3.4*  CL 97*  --  105 106  CO2 19*  --  24 26  GLUCOSE 128*  --  120* 104*  BUN 27*  --  19 11  CREATININE 3.28*  --  1.29* 0.94  CALCIUM 9.6  --  8.5* 8.6*  MG  --  2.0  --   --    Liver Function Tests: Recent Labs  Lab 02/01/21 0946 02/02/21 0817  AST 34 27  ALT 27 26  ALKPHOS 94 66  BILITOT 0.4 0.4  PROT 9.0* 6.1*  ALBUMIN 4.1 2.7*   Recent Labs  Lab 02/01/21 0946  LIPASE 37   No results for input(s): AMMONIA in the last 168 hours. CBC: Recent Labs  Lab 02/01/21 0946 02/02/21 0817 02/03/21 0329  WBC 13.2* 11.8* 11.7*  NEUTROABS 9.4*  --   --   HGB 16.7* 12.8 12.1  HCT 48.5* 37.8 36.8  MCV 87.5 88.1 90.6  PLT 544* 389 395   Cardiac Enzymes: No results for input(s): CKTOTAL, CKMB, CKMBINDEX, TROPONINI in the last 168 hours. BNP: BNP (last 3  results) No results for input(s): BNP in the last 8760 hours.  ProBNP (last 3 results) No results for input(s): PROBNP in the last 8760 hours.  CBG: No results for input(s): GLUCAP in the last 168 hours.     Signed:  Myrtie Neither, MD [facility] 02/03/2021, 8:46 PM

## 2021-02-03 NOTE — Care Management (Signed)
Spoke w patient at bedside to discuss medication affordability. Introduced her to the [employer] savings plan at [employer]. She currently uses [facility] and will continue to for meds needed for DC. She plans to look into the program and potentially sign herself up as it would likely save her money.

## 2021-02-03 NOTE — Plan of Care (Signed)
  Problem: Education: Goal: Knowledge of General Education information will improve Description: Including pain rating scale, medication(s)/side effects and non-pharmacologic comfort measures Outcome: Adequate for Discharge   Problem: Health Behavior/Discharge Planning: Goal: Ability to manage health-related needs will improve Outcome: Adequate for Discharge   Problem: Clinical Measurements: Goal: Ability to maintain clinical measurements within normal limits will improve Outcome: Adequate for Discharge Goal: Will remain free from infection Outcome: Adequate for Discharge Goal: Diagnostic test results will improve Outcome: Adequate for Discharge Goal: Respiratory complications will improve Outcome: Adequate for Discharge Goal: Cardiovascular complication will be avoided Outcome: Adequate for Discharge   Problem: Activity: Goal: Risk for activity intolerance will decrease Outcome: Adequate for Discharge   Problem: Nutrition: Goal: Adequate nutrition will be maintained Outcome: Adequate for Discharge   Problem: Coping: Goal: Level of anxiety will decrease Outcome: Adequate for Discharge   Problem: Elimination: Goal: Will not experience complications related to bowel motility 02/03/2021 1213 by Katrina Stack, RN Outcome: Adequate for Discharge 02/03/2021 0744 by Katrina Stack, RN Outcome: Progressing Goal: Will not experience complications related to urinary retention Outcome: Adequate for Discharge   Problem: Pain Managment: Goal: General experience of comfort will improve Outcome: Adequate for Discharge   Problem: Safety: Goal: Ability to remain free from injury will improve Outcome: Adequate for Discharge   Problem: Skin Integrity: Goal: Risk for impaired skin integrity will decrease Outcome: Adequate for Discharge   Problem: Inadequate Intake (NI-2.1) Goal: Food and/or nutrient delivery Description: Individualized approach for food/nutrient  provision. Outcome: Adequate for Discharge

## 2021-02-03 NOTE — Progress Notes (Signed)
DISCHARGE NOTE HOME Rachel Maldonado to be discharged Home per MD order. Discussed prescriptions and follow up appointments with the patient. Prescriptions given to patient; medication list explained in detail. Patient verbalized understanding.  Skin clean, dry and intact without evidence of skin break down, no evidence of skin tears noted. IV catheter discontinued intact. Site without signs and symptoms of complications. Dressing and pressure applied. Pt denies pain at the site currently. No complaints noted.  Patient free of lines, drains, and wounds.   An After Visit Summary (AVS) was printed and given to the patient. Patient escorted via wheelchair, and discharged home via private auto.  Katrina Stack, RN
